# Patient Record
Sex: Male | Born: 1950 | Race: White | Hispanic: No | Marital: Married | Smoking: Never smoker
Health system: Southern US, Community
[De-identification: ages and names within clinical notes are randomized; demographics above are authoritative.]

## PROBLEM LIST (undated history)

## (undated) DIAGNOSIS — M1990 Unspecified osteoarthritis, unspecified site: Secondary | ICD-10-CM

## (undated) DIAGNOSIS — E039 Hypothyroidism, unspecified: Secondary | ICD-10-CM

## (undated) DIAGNOSIS — K432 Incisional hernia without obstruction or gangrene: Secondary | ICD-10-CM

## (undated) DIAGNOSIS — T8859XA Other complications of anesthesia, initial encounter: Secondary | ICD-10-CM

## (undated) DIAGNOSIS — G7000 Myasthenia gravis without (acute) exacerbation: Secondary | ICD-10-CM

## (undated) DIAGNOSIS — E785 Hyperlipidemia, unspecified: Secondary | ICD-10-CM

## (undated) DIAGNOSIS — G20A1 Parkinson's disease without dyskinesia, without mention of fluctuations: Secondary | ICD-10-CM

## (undated) DIAGNOSIS — I10 Essential (primary) hypertension: Secondary | ICD-10-CM

## (undated) DIAGNOSIS — G20 Parkinson's disease: Secondary | ICD-10-CM

## (undated) HISTORY — PX: OTHER SURGICAL HISTORY: SHX169

## (undated) HISTORY — PX: ABDOMINAL SURGERY: SHX537

## (undated) HISTORY — PX: APPENDECTOMY: SHX54

## (undated) HISTORY — PX: HERNIA REPAIR: SHX51

---

## 2019-04-08 HISTORY — PX: COLONOSCOPY: SHX174

## 2021-04-11 ENCOUNTER — Emergency Department: Payer: Medicare Other

## 2021-04-11 ENCOUNTER — Emergency Department
Admission: EM | Admit: 2021-04-11 | Discharge: 2021-04-11 | Disposition: A | Discharge status: Home / Self Care | Payer: Medicare Other | Attending: Emergency Medicine | Admitting: Emergency Medicine

## 2021-04-11 ENCOUNTER — Other Ambulatory Visit: Payer: Self-pay

## 2021-04-11 DIAGNOSIS — K4090 Unilateral inguinal hernia, without obstruction or gangrene, not specified as recurrent: Secondary | ICD-10-CM | POA: Insufficient documentation

## 2021-04-11 DIAGNOSIS — G20 Parkinson's disease: Secondary | ICD-10-CM | POA: Insufficient documentation

## 2021-04-11 DIAGNOSIS — K8020 Calculus of gallbladder without cholecystitis without obstruction: Secondary | ICD-10-CM | POA: Diagnosis not present

## 2021-04-11 DIAGNOSIS — I10 Essential (primary) hypertension: Secondary | ICD-10-CM | POA: Diagnosis not present

## 2021-04-11 DIAGNOSIS — R1032 Left lower quadrant pain: Secondary | ICD-10-CM | POA: Diagnosis present

## 2021-04-11 HISTORY — DX: Parkinson's disease without dyskinesia, without mention of fluctuations: G20.A1

## 2021-04-11 HISTORY — DX: Myasthenia gravis without (acute) exacerbation: G70.00

## 2021-04-11 HISTORY — DX: Parkinson's disease: G20

## 2021-04-11 HISTORY — DX: Essential (primary) hypertension: I10

## 2021-04-11 LAB — COMPREHENSIVE METABOLIC PANEL
ALT: 8 U/L (ref 0–44)
AST: 20 U/L (ref 15–41)
Albumin: 4.4 g/dL (ref 3.5–5.0)
Alkaline Phosphatase: 83 U/L (ref 38–126)
Anion gap: 6 (ref 5–15)
BUN: 26 mg/dL — ABNORMAL HIGH (ref 8–23)
CO2: 29 mmol/L (ref 22–32)
Calcium: 9.4 mg/dL (ref 8.9–10.3)
Chloride: 100 mmol/L (ref 98–111)
Creatinine, Ser: 1.01 mg/dL (ref 0.61–1.24)
GFR, Estimated: >60 mL/min (ref >60)
Glucose, Bld: 104 mg/dL — ABNORMAL HIGH (ref 70–99)
Potassium: 4.9 mmol/L (ref 3.5–5.1)
Sodium: 135 mmol/L (ref 135–145)
Total Bilirubin: 1 mg/dL (ref 0.3–1.2)
Total Protein: 7.5 g/dL (ref 6.5–8.1)

## 2021-04-11 LAB — CBC WITH DIFFERENTIAL/PLATELET
Abs Immature Granulocytes: 0.01 10*3/uL (ref 0.00–0.07)
Basophils Absolute: 0 10*3/uL (ref 0.0–0.1)
Basophils Relative: 1 %
Eosinophils Absolute: 0 10*3/uL (ref 0.0–0.5)
Eosinophils Relative: 1 %
HCT: 43.5 % (ref 39.0–52.0)
Hemoglobin: 14.5 g/dL (ref 13.0–17.0)
Immature Granulocytes: 0 %
Lymphocytes Relative: 12 %
Lymphs Abs: 0.6 10*3/uL — ABNORMAL LOW (ref 0.7–4.0)
MCH: 32.4 pg (ref 26.0–34.0)
MCHC: 33.3 g/dL (ref 30.0–36.0)
MCV: 97.1 fL (ref 80.0–100.0)
Monocytes Absolute: 0.3 10*3/uL (ref 0.1–1.0)
Monocytes Relative: 6 %
Neutro Abs: 4.1 10*3/uL (ref 1.7–7.7)
Neutrophils Relative %: 80 %
Platelets: 204 10*3/uL (ref 150–400)
RBC: 4.48 MIL/uL (ref 4.22–5.81)
RDW: 12.6 % (ref 11.5–15.5)
WBC: 5.1 10*3/uL (ref 4.0–10.5)
nRBC: 0 % (ref 0.0–0.2)

## 2021-04-11 MED ORDER — IOHEXOL 300 MG/ML  SOLN
100.0000 mL | Freq: Once | INTRAMUSCULAR | Status: AC | PRN
  Administered 2021-04-11: 100 mL via INTRAVENOUS

## 2021-04-11 MED ORDER — IBUPROFEN 400 MG PO TABS
400.0000 mg | ORAL_TABLET | Freq: Once | ORAL | Status: AC
  Administered 2021-04-11: 400 mg via ORAL
  Filled 2021-04-11: qty 1

## 2021-04-11 NOTE — Discharge Instructions (Addendum)
You have a small inguinal hernia that contains fat.  If you develop worsening pain, vomiting fevers or notice a bulge that is not able to be reduced, please return to the emergency department.  Please call general surgery clinic at [facility] tomorrow to schedule an appointment.

## 2021-04-11 NOTE — ED Triage Notes (Signed)
Pt to ER via POV with complaints of left sided groin pain. Reports hx of hernia repair on affected side. States pain is sharp and burning, reports bulging in area.

## 2021-04-11 NOTE — ED Provider Triage Note (Signed)
Emergency Medicine Provider Triage Evaluation Note  Martin Orozco , a 71 y.o. male  was evaluated in triage.  Pt complains of left groin pain.  History of hernia on the right side.  States small bulging area with burning sensation.  Was able to palpate back earlier today.  Review of Systems  Positive: Left-sided groin pain, swelling Negative: Fever, chills  Physical Exam  There were no vitals taken for this visit. Gen:   Awake, no distress   Resp:  Normal effort  MSK:   Moves extremities without difficulty  Other:  Tender in left groin  Medical Decision Making  Medically screening exam initiated at 1:32 PM.  Appropriate orders placed.  [name] was informed that the remainder of the evaluation will be completed by another provider, this initial triage assessment does not replace that evaluation, and the importance of remaining in the ED until their evaluation is complete.     Faythe Ghee, PA-C 04/11/21 1333

## 2021-04-11 NOTE — ED Provider Notes (Signed)
[facility] Provider Note    Event Date/Time   First MD Initiated Contact with Patient 04/11/21 1740     (approximate)   History   Groin Pain   HPI  Martin Orozco is a 71 y.o. male past medical history of hypertension, Parkinson's and ocular myasthenia gravis who presents with left groin pain.  Over the last several weeks he has had intermittent pain in the left groin which she describes as like a twinge.  This morning it became more acute and has had some ongoing pain.  Denies nausea vomiting fevers chills constipation or diarrhea.  Denies any skin changes.  Has had hernia repaired on the right side but never on the left.    Past Medical History:  Diagnosis Date   Hypertension    Ocular myasthenia gravis ([place])    Parkinson's disease ([place])     There are no problems to display for this patient.    Physical Exam  Triage Vital Signs: ED Triage Vitals  Enc Vitals Group     BP 04/11/21 1332 (!) 144/77     Pulse Rate 04/11/21 1332 68     Resp 04/11/21 1332 18     Temp 04/11/21 1332 97.9 F (36.6 C)     Temp Source 04/11/21 1332 Oral     SpO2 04/11/21 1332 96 %     Weight 04/11/21 1333 190 lb (86.2 kg)     Height 04/11/21 1333 5\' 8"  (1.727 m)     Head Circumference --      Peak Flow --      Pain Score 04/11/21 1333 5     Pain Loc --      Pain Edu? --      Excl. in [place]? --     Most recent vital signs: Vitals:   04/11/21 1332 04/11/21 1801  BP: (!) 144/77 140/80  Pulse: 68 78  Resp: 18 18  Temp: 97.9 F (36.6 C)   SpO2: 96% 99%     General: Awake, no distress.  CV:  Good peripheral perfusion.  Resp:  Normal effort.  Abd:  No distention.  Abdomen is nontender GU:  No obvious inguinal bulge, there is a palpable inguinal hernia that is reducible, no overlying erythema Neuro:             Awake, Alert, Oriented x 3  Other:     ED Results / Procedures / Treatments  Labs (all labs ordered are listed, but only abnormal results are  displayed) Labs Reviewed  COMPREHENSIVE METABOLIC PANEL - Abnormal; Notable for the following components:      Result Value   Glucose, Bld 104 (*)    BUN 26 (*)    All other components within normal limits  CBC WITH DIFFERENTIAL/PLATELET - Abnormal; Notable for the following components:   Lymphs Abs 0.6 (*)    All other components within normal limits     EKG    RADIOLOGY Viewed the CT abdomen pelvis which shows no acute findings, there is a fat-containing left inguinal hernia, radiology report   PROCEDURES:  Critical Care performed: No  Procedures    MEDICATIONS ORDERED IN ED: Medications  iohexol (OMNIPAQUE) 300 MG/ML solution 100 mL (100 mLs Intravenous Contrast Given 04/11/21 1553)  ibuprofen (ADVIL) tablet 400 mg (400 mg Oral Given 04/11/21 1828)     IMPRESSION / MDM / [place] / ED COURSE  I reviewed the triage vital signs and the nursing notes.  Differential diagnosis includes, but is not limited to, incarcerated hernia, reducible hernia, testicular torsion, epididymitis  71 year old male ending with left inguinal pain.  He is concerned about a hernia.  Had been having pain on and off for several weeks which worsened today.  No symptoms of obstruction or infection.  Vital signs within normal limits.  Patient appears very comfortable on exam.  His abdomen is benign.  And GU exam there is a subtle inguinal hernia that I can palpate but is able to be reduced and there is no overlying tenderness or erythema.  CT abdomen pelvis was obtained which shows a small fat-containing inguinal hernia.  No indication for emergent surgical consult given no evidence of incarceration or strangulation.  Testicular exam is otherwise normal.  Labs were obtained which I reviewed and are unremarkable.  Patient was given Motrin in the ED for pain advised that he follow-up with general surgery for elective repair given he is symptomatic.  He is otherwise  stable for discharge.  We discussed return precautions.     FINAL CLINICAL IMPRESSION(S) / ED DIAGNOSES   Final diagnoses:  Unilateral inguinal hernia without obstruction or gangrene, recurrence not specified     Rx / DC Orders   ED Discharge Orders     None        Note:  This document was prepared using Dragon voice recognition software and may include unintentional dictation errors.   Rada Hay, MD 04/11/21 [phone]

## 2021-04-11 NOTE — ED Notes (Signed)
E-signature not working at this time. Pt verbalized understanding of D/C instructions, prescriptions and follow up care with no further questions at this time. Pt in NAD and ambulatory at time of D/C.  

## 2021-04-11 NOTE — ED Notes (Signed)
Multiple RN attempt IV access and blood draw. No success. IV team consult order placed

## 2021-04-16 ENCOUNTER — Ambulatory Visit: Payer: Self-pay | Admitting: Surgery

## 2021-04-16 NOTE — H&P (Signed)
Subjective: ° °CC: Non-recurrent unilateral inguinal hernia without obstruction or gangrene [K40.90] ° °HPI: ° Martin Orozco is a 71 y.o. male who was referred by Emergency Room for evaluation of above. Symptoms were first noted 6 months ago. Pain is intermittent and discomfort, confined to the left groin, without radiation.  Associated with nothing, exacerbated by exertion.  Lump is reducible.  °  °Past Medical History:  has a past medical history of Hyperlipidemia, Hypertension, Ocular myasthenia gravis (CMS-HCC), Parkinsonism (CMS-HCC), and Thyroid disease. ° °Past Surgical History:  °Past Surgical History: °Procedure Laterality Date ° angiogram   ° APPENDECTOMY   ° knee surgery   ° ° °Family History: family history includes High blood pressure (Hypertension) in his father; Neuropathy in his father; No Known Problems in his mother. ° °Social History:  reports that he has never smoked. He has never used smokeless tobacco. He reports that he does not currently use alcohol. He reports that he does not use drugs. ° °Current Medications: has a current medication list which includes the following prescription(s): atorvastatin, carbidopa-levodopa, coenzyme q10, levothyroxine, olmesartan, azathioprine, and pyridostigmine. ° °Allergies:  °Allergies as of 04/15/2021 ° (No Known Allergies) ° ° °ROS:  °A 15 point review of systems was performed and pertinent positives and negatives noted in HPI °  °Objective: °  ° °BP (!) 140/71    Pulse 78    Ht 172.7 cm (5' 8")    Wt 86.2 kg (190 lb)    BMI 28.89 kg/m²  ° °Constitutional :  Alert, cooperative, no distress °Lymphatics/Throat:  Supple, no lymphadenopathy °Respiratory:  clear to auscultation bilaterally °Cardiovascular:  regular rate and rhythm °Gastrointestinal: soft, non-tender; bowel sounds normal; no masses,  no organomegaly. inguinal hernia noted.  small, reducible, no overlying skin changes and LEFT °Musculoskeletal: Steady gait and movement °Skin: Cool and moist, NO  surgical scars °Psychiatric: Normal affect, non-agitated, not confused °  °  °LABS:  °n/a  ° °RADS: °CLINICAL DATA:  Left-sided groin pain. Prior hernia repair. Palpable  °abnormality in this region.  ° °EXAM:  °CT ABDOMEN AND PELVIS WITH CONTRAST  ° °TECHNIQUE:  °Multidetector CT imaging of the abdomen and pelvis was performed  °using the standard protocol following bolus administration of  °intravenous contrast.  ° °CONTRAST:  100mL OMNIPAQUE IOHEXOL 300 MG/ML  SOLN  ° °COMPARISON:  None.  ° °FINDINGS:  °Lower chest: Mild left hemidiaphragm elevation. Right base scarring.  °Normal heart size without pericardial or pleural effusion.  ° °Hepatobiliary: Normal liver. Heterogeneous density within the  °gallbladder is likely due to dependent sludge including on 27/6 and  °the nondependent stones including on 21/6. No acute cholecystitis or  °biliary duct dilatation.  ° °Pancreas: Normal, without mass or ductal dilatation.  ° °Spleen: Normal in size, without focal abnormality.  ° °Adrenals/Urinary Tract: Normal adrenal glands. Small bilateral renal  °sinus cysts. No hydronephrosis.  ° °Stomach/Bowel: Normal stomach, without wall thickening. Extensive  °colonic diverticulosis. Normal colon and terminal ileum. Normal  °small bowel.  ° °Vascular/Lymphatic: Aortic atherosclerosis. No abdominopelvic  °adenopathy.  ° °Reproductive: Normal prostate.  ° °Other: No significant free fluid. No free intraperitoneal air. Small  °fat containing left inguinal hernia, including on 84/6.  ° °Tiny periumbilical fat containing ventral abdominal wall hernia.  ° °Musculoskeletal: Lumbosacral spondylosis.  Hemangioma within L1.  ° °IMPRESSION:  °1. No acute process in the abdomen or pelvis.  °2. Small fat containing left inguinal hernia.  °3. Cholelithiasis.  °4. Aortic Atherosclerosis (ICD10-I70.0).   ° °Assessment: ° °    °  Non-recurrent unilateral inguinal hernia without obstruction or gangrene [K40.90]  Plan:    1. Non-recurrent  unilateral inguinal hernia without obstruction or gangrene [K40.90]   Discussed the risk of surgery including recurrence, which can be up to 50% in the case of incisional or complex hernias, possible use of prosthetic materials (mesh) and the increased risk of mesh infxn if used, bleeding, chronic pain, post-op infxn, post-op SBO or ileus, and possible re-operation to address said risks. The risks of general anesthetic, if used, includes MI, CVA, sudden death or even reaction to anesthetic medications also discussed. Alternatives include continued observation.  Benefits include possible symptom relief, prevention of incarceration, strangulation, enlargement in size over time, and the risk of emergency surgery in the face of strangulation.   Typical post-op recovery time of 3-5 days with 2 weeks of activity restrictions were also discussed.  ED return precautions given for sudden increase in pain, size of hernia with accompanying fever, nausea, and/or vomiting.  The patient verbalized understanding and all questions were answered to the patient's satisfaction.   2. Patient has elected to proceed with surgical treatment. Procedure will be scheduled. LEFT, robotic assisted laparoscopic

## 2021-04-16 NOTE — H&P (View-Only) (Signed)
Subjective:  CC: Non-recurrent unilateral inguinal hernia without obstruction or gangrene [K40.90]  HPI:  Martin Orozco is a 71 y.o. male who was referred by Emergency Room for evaluation of above. Symptoms were first noted 6 months ago. Pain is intermittent and discomfort, confined to the left groin, without radiation.  Associated with nothing, exacerbated by exertion.  Lump is reducible.    Past Medical History:  has a past medical history of Hyperlipidemia, Hypertension, Ocular myasthenia gravis (CMS-HCC), Parkinsonism (CMS-HCC), and Thyroid disease.  Past Surgical History:  Past Surgical History: Procedure Laterality Date  angiogram    APPENDECTOMY    knee surgery     Family History: family history includes High blood pressure (Hypertension) in his father; Neuropathy in his father; No Known Problems in his mother.  Social History:  reports that he has never smoked. He has never used smokeless tobacco. He reports that he does not currently use alcohol. He reports that he does not use drugs.  Current Medications: has a current medication list which includes the following prescription(s): atorvastatin, carbidopa-levodopa, coenzyme q10, levothyroxine, olmesartan, azathioprine, and pyridostigmine.  Allergies:  Allergies as of 04/15/2021  (No Known Allergies)   ROS:  A 15 point review of systems was performed and pertinent positives and negatives noted in HPI   Objective:    BP (!) 140/71    Pulse 78    Ht 172.7 cm (5\' 8" )    Wt 86.2 kg (190 lb)    BMI 28.89 kg/m   Constitutional :  Alert, cooperative, no distress Lymphatics/Throat:  Supple, no lymphadenopathy Respiratory:  clear to auscultation bilaterally Cardiovascular:  regular rate and rhythm Gastrointestinal: soft, non-tender; bowel sounds normal; no masses,  no organomegaly. inguinal hernia noted.  small, reducible, no overlying skin changes and LEFT Musculoskeletal: Steady gait and movement Skin: Cool and moist, NO  surgical scars Psychiatric: Normal affect, non-agitated, not confused     LABS:  n/a   RADS: CLINICAL DATA:  Left-sided groin pain. Prior hernia repair. Palpable  abnormality in this region.   EXAM:  CT ABDOMEN AND PELVIS WITH CONTRAST   TECHNIQUE:  Multidetector CT imaging of the abdomen and pelvis was performed  using the standard protocol following bolus administration of  intravenous contrast.   CONTRAST:  OMNIPAQUE IOHEXOL 300 MG/ML  SOLN   COMPARISON:  None.   FINDINGS:  Lower chest: Mild left hemidiaphragm elevation. Right base scarring.  Normal heart size without pericardial or pleural effusion.   Hepatobiliary: Normal liver. Heterogeneous density within the  gallbladder is likely due to dependent sludge including on 27/6 and  the nondependent stones including on 21/6. No acute cholecystitis or  biliary duct dilatation.   Pancreas: Normal, without mass or ductal dilatation.   Spleen: Normal in size, without focal abnormality.   Adrenals/Urinary Tract: Normal adrenal glands. Small bilateral renal  sinus cysts. No hydronephrosis.   Stomach/Bowel: Normal stomach, without wall thickening. Extensive  colonic diverticulosis. Normal colon and terminal ileum. Normal  small bowel.   Vascular/Lymphatic: Aortic atherosclerosis. No abdominopelvic  adenopathy.   Reproductive: Normal prostate.   Other: No significant free fluid. No free intraperitoneal air. Small  fat containing left inguinal hernia, including on 84/6.   Tiny periumbilical fat containing ventral abdominal wall hernia.   Musculoskeletal: Lumbosacral spondylosis.  Hemangioma within L1.   IMPRESSION:  1. No acute process in the abdomen or pelvis.  2. Small fat containing left inguinal hernia.  3. Cholelithiasis.  4. Aortic Atherosclerosis (ICD10-I70.0).    Assessment:  Non-recurrent unilateral inguinal hernia without obstruction or gangrene [K40.90]  Plan:    1. Non-recurrent  unilateral inguinal hernia without obstruction or gangrene [K40.90]   Discussed the risk of surgery including recurrence, which can be up to 50% in the case of incisional or complex hernias, possible use of prosthetic materials (mesh) and the increased risk of mesh infxn if used, bleeding, chronic pain, post-op infxn, post-op SBO or ileus, and possible re-operation to address said risks. The risks of general anesthetic, if used, includes MI, CVA, sudden death or even reaction to anesthetic medications also discussed. Alternatives include continued observation.  Benefits include possible symptom relief, prevention of incarceration, strangulation, enlargement in size over time, and the risk of emergency surgery in the face of strangulation.   Typical post-op recovery time of 3-5 days with 2 weeks of activity restrictions were also discussed.  ED return precautions given for sudden increase in pain, size of hernia with accompanying fever, nausea, and/or vomiting.  The patient verbalized understanding and all questions were answered to the patient's satisfaction.   2. Patient has elected to proceed with surgical treatment. Procedure will be scheduled. LEFT, robotic assisted laparoscopic

## 2021-04-22 MED ORDER — CHLORHEXIDINE GLUCONATE 0.12 % MT SOLN: 15.0000 mL | Freq: Once | OROMUCOSAL | Status: AC

## 2021-04-22 MED ORDER — CHLORHEXIDINE GLUCONATE CLOTH 2 % EX PADS: 6.0000 | MEDICATED_PAD | Freq: Once | CUTANEOUS | Status: DC

## 2021-04-22 MED ORDER — ORAL CARE MOUTH RINSE: 15.0000 mL | Freq: Once | OROMUCOSAL | Status: AC

## 2021-04-22 MED ORDER — CEFAZOLIN SODIUM-DEXTROSE 2-4 GM/100ML-% IV SOLN
2.0000 g | INTRAVENOUS | Status: AC
  Administered 2021-04-23: 2 g via INTRAVENOUS

## 2021-04-22 MED ORDER — ACETAMINOPHEN 500 MG PO TABS
1000.0000 mg | ORAL_TABLET | ORAL | Status: AC
Start: 2021-04-23 — End: 2021-04-23

## 2021-04-22 MED ORDER — CELECOXIB 200 MG PO CAPS: 200.0000 mg | ORAL_CAPSULE | ORAL | Status: AC

## 2021-04-22 MED ORDER — GABAPENTIN 300 MG PO CAPS: 300.0000 mg | ORAL_CAPSULE | ORAL | Status: AC

## 2021-04-22 MED ORDER — LACTATED RINGERS IV SOLN: INTRAVENOUS | Status: DC

## 2021-04-22 NOTE — Anesthesia Preprocedure Evaluation (Addendum)
Anesthesia Evaluation  Patient identified by MRN, date of birth, ID band Patient awake    Reviewed: Allergy & Precautions, NPO status , Patient's Chart, lab work & pertinent test results  History of Anesthesia Complications Negative for: history of anesthetic complications  Airway Mallampati: IV   Neck ROM: Full    Dental no notable dental hx.    Pulmonary neg pulmonary ROS,    Pulmonary exam normal breath sounds clear to auscultation       Cardiovascular hypertension, Normal cardiovascular exam Rhythm:Regular Rate:Normal     Neuro/Psych  Neuromuscular disease (ocular myasthenia gravis; Parkinson disease)    GI/Hepatic negative GI ROS,   Endo/Other  negative endocrine ROS  Renal/GU negative Renal ROS     Musculoskeletal   Abdominal   Peds  Hematology negative hematology ROS (+)   Anesthesia Other Findings   Reproductive/Obstetrics                            Anesthesia Physical Anesthesia Plan  ASA: 3  Anesthesia Plan: General   Post-op Pain Management:    Induction: Intravenous  PONV Risk Score and Plan: 2 and Ondansetron, Dexamethasone and Treatment may vary due to age or medical condition  Airway Management Planned: Oral ETT  Additional Equipment:   Intra-op Plan:   Post-operative Plan: Extubation in OR and Possible Post-op intubation/ventilation  Informed Consent: I have reviewed the patients History and Physical, chart, labs and discussed the procedure including the risks, benefits and alternatives for the proposed anesthesia with the patient or authorized representative who has indicated his/her understanding and acceptance.     Dental advisory given  Plan Discussed with: CRNA  Anesthesia Plan Comments: (Considerations for myasthenia gravis: patients are resistant to succinylcholine and sensitive to NDMRs; plan to avoid NDMRs, or titrate to twitches if necessary.   Patient consented for postoperative ventilation if needed.  Patient consented for risks of anesthesia including but not limited to:  - adverse reactions to medications - damage to eyes, teeth, lips or other oral mucosa - nerve damage due to positioning  - sore throat or hoarseness - damage to heart, brain, nerves, lungs, other parts of body or loss of life  Informed patient about role of CRNA in peri- and intra-operative care.  Patient voiced understanding.)        Anesthesia Quick Evaluation

## 2021-04-23 ENCOUNTER — Ambulatory Visit: Payer: Medicare Other | Admitting: Anesthesiology

## 2021-04-23 ENCOUNTER — Other Ambulatory Visit: Payer: Self-pay

## 2021-04-23 ENCOUNTER — Ambulatory Visit
Admission: RE | Admit: 2021-04-23 | Discharge: 2021-04-23 | Disposition: A | Discharge status: Home / Self Care | Payer: Medicare Other | Attending: Surgery | Admitting: Surgery

## 2021-04-23 ENCOUNTER — Encounter
Admission: RE | Disposition: A | Discharge status: Home / Self Care | Payer: Self-pay | Source: Home / Self Care | Attending: Surgery

## 2021-04-23 ENCOUNTER — Encounter: Payer: Self-pay | Admitting: Surgery

## 2021-04-23 DIAGNOSIS — I10 Essential (primary) hypertension: Secondary | ICD-10-CM | POA: Insufficient documentation

## 2021-04-23 DIAGNOSIS — G7000 Myasthenia gravis without (acute) exacerbation: Secondary | ICD-10-CM | POA: Diagnosis not present

## 2021-04-23 DIAGNOSIS — G20 Parkinson's disease: Secondary | ICD-10-CM | POA: Insufficient documentation

## 2021-04-23 DIAGNOSIS — K4090 Unilateral inguinal hernia, without obstruction or gangrene, not specified as recurrent: Secondary | ICD-10-CM | POA: Diagnosis present

## 2021-04-23 HISTORY — PX: INSERTION OF MESH: SHX5868

## 2021-04-23 SURGERY — HERNIORRHAPHY, INGUINAL, ROBOT-ASSISTED, LAPAROSCOPIC
Anesthesia: General | Site: Inguinal | Laterality: Left

## 2021-04-23 MED ORDER — PROPOFOL 10 MG/ML IV BOLUS
INTRAVENOUS | Status: AC
  Filled 2021-04-23: qty 20

## 2021-04-23 MED ORDER — CEFAZOLIN SODIUM-DEXTROSE 2-4 GM/100ML-% IV SOLN
INTRAVENOUS | Status: AC
  Filled 2021-04-23: qty 100

## 2021-04-23 MED ORDER — BUPIVACAINE-EPINEPHRINE (PF) 0.5% -1:200000 IJ SOLN
INTRAMUSCULAR | Status: AC
  Filled 2021-04-23: qty 30

## 2021-04-23 MED ORDER — BUPIVACAINE LIPOSOME 1.3 % IJ SUSP
INTRAMUSCULAR | Status: DC | PRN
  Administered 2021-04-23: 20 mL

## 2021-04-23 MED ORDER — CELECOXIB 200 MG PO CAPS
ORAL_CAPSULE | ORAL | Status: AC
  Administered 2021-04-23: 200 mg via ORAL
  Filled 2021-04-23: qty 1

## 2021-04-23 MED ORDER — HYDROCODONE-ACETAMINOPHEN 5-325 MG PO TABS
1.0000 | ORAL_TABLET | Freq: Four times a day (QID) | ORAL | 0 refills | Status: DC | PRN
Start: 2021-04-23 — End: 2023-08-21

## 2021-04-23 MED ORDER — LIDOCAINE HCL (PF) 2 % IJ SOLN
INTRAMUSCULAR | Status: AC
  Filled 2021-04-23: qty 5

## 2021-04-23 MED ORDER — SEVOFLURANE IN SOLN
RESPIRATORY_TRACT | Status: AC
  Filled 2021-04-23: qty 250

## 2021-04-23 MED ORDER — LIDOCAINE HCL (CARDIAC) PF 100 MG/5ML IV SOSY
PREFILLED_SYRINGE | INTRAVENOUS | Status: DC | PRN
Start: 2021-04-23 — End: 2021-04-23
  Administered 2021-04-23: 80 mg via INTRAVENOUS

## 2021-04-23 MED ORDER — ONDANSETRON HCL 4 MG/2ML IJ SOLN
INTRAMUSCULAR | Status: DC | PRN
Start: 2021-04-23 — End: 2021-04-23
  Administered 2021-04-23: 4 mg via INTRAVENOUS

## 2021-04-23 MED ORDER — ACETAMINOPHEN 10 MG/ML IV SOLN: 1000.0000 mg | Freq: Once | INTRAVENOUS | Status: DC | PRN

## 2021-04-23 MED ORDER — CHLORHEXIDINE GLUCONATE 0.12 % MT SOLN
OROMUCOSAL | Status: AC
  Administered 2021-04-23: 15 mL via OROMUCOSAL
  Filled 2021-04-23: qty 15

## 2021-04-23 MED ORDER — LACTATED RINGERS IV SOLN: INTRAVENOUS | Status: DC

## 2021-04-23 MED ORDER — DEXAMETHASONE SODIUM PHOSPHATE 10 MG/ML IJ SOLN
INTRAMUSCULAR | Status: AC
  Filled 2021-04-23: qty 1

## 2021-04-23 MED ORDER — OXYCODONE HCL 5 MG PO TABS
ORAL_TABLET | ORAL | Status: AC
  Filled 2021-04-23: qty 1

## 2021-04-23 MED ORDER — GABAPENTIN 300 MG PO CAPS
ORAL_CAPSULE | ORAL | Status: AC
  Administered 2021-04-23: 300 mg via ORAL
  Filled 2021-04-23: qty 1

## 2021-04-23 MED ORDER — SUGAMMADEX SODIUM 200 MG/2ML IV SOLN
INTRAVENOUS | Status: DC | PRN
  Administered 2021-04-23: 200 mg via INTRAVENOUS

## 2021-04-23 MED ORDER — BUPIVACAINE-EPINEPHRINE (PF) 0.5% -1:200000 IJ SOLN: INTRAMUSCULAR | Status: DC | PRN

## 2021-04-23 MED ORDER — ACETAMINOPHEN 500 MG PO TABS
ORAL_TABLET | ORAL | Status: AC
  Administered 2021-04-23: 1000 mg via ORAL
  Filled 2021-04-23: qty 2

## 2021-04-23 MED ORDER — BUPIVACAINE-EPINEPHRINE 0.5% -1:200000 IJ SOLN
INTRAMUSCULAR | Status: DC | PRN
  Administered 2021-04-23: 22 mL

## 2021-04-23 MED ORDER — PROPOFOL 10 MG/ML IV BOLUS
INTRAVENOUS | Status: DC | PRN
  Administered 2021-04-23: 160 mg via INTRAVENOUS

## 2021-04-23 MED ORDER — GLYCOPYRROLATE 0.2 MG/ML IJ SOLN
INTRAMUSCULAR | Status: AC
  Filled 2021-04-23: qty 1

## 2021-04-23 MED ORDER — OXYCODONE HCL 5 MG PO TABS
5.0000 mg | ORAL_TABLET | Freq: Once | ORAL | Status: AC | PRN
  Administered 2021-04-23: 5 mg via ORAL

## 2021-04-23 MED ORDER — BUPIVACAINE LIPOSOME 1.3 % IJ SUSP
INTRAMUSCULAR | Status: AC
  Filled 2021-04-23: qty 20

## 2021-04-23 MED ORDER — 0.9 % SODIUM CHLORIDE (POUR BTL) OPTIME
TOPICAL | Status: DC | PRN
  Administered 2021-04-23: 25 mL

## 2021-04-23 MED ORDER — PHENYLEPHRINE HCL (PRESSORS) 10 MG/ML IV SOLN
INTRAVENOUS | Status: DC | PRN
  Administered 2021-04-23 (×2): 160 ug via INTRAVENOUS
  Administered 2021-04-23 (×4): 80 ug via INTRAVENOUS

## 2021-04-23 MED ORDER — FENTANYL CITRATE (PF) 100 MCG/2ML IJ SOLN
INTRAMUSCULAR | Status: AC
  Filled 2021-04-23: qty 2

## 2021-04-23 MED ORDER — ONDANSETRON HCL 4 MG/2ML IJ SOLN: 4.0000 mg | Freq: Once | INTRAMUSCULAR | Status: DC | PRN

## 2021-04-23 MED ORDER — PHENYLEPHRINE HCL-NACL 20-0.9 MG/250ML-% IV SOLN
INTRAVENOUS | Status: AC
  Filled 2021-04-23: qty 250

## 2021-04-23 MED ORDER — DEXAMETHASONE SODIUM PHOSPHATE 10 MG/ML IJ SOLN
INTRAMUSCULAR | Status: DC | PRN
Start: 2021-04-23 — End: 2021-04-23
  Administered 2021-04-23: 10 mg via INTRAVENOUS

## 2021-04-23 MED ORDER — ROCURONIUM BROMIDE 10 MG/ML (PF) SYRINGE
PREFILLED_SYRINGE | INTRAVENOUS | Status: AC
  Filled 2021-04-23: qty 10

## 2021-04-23 MED ORDER — ROCURONIUM BROMIDE 100 MG/10ML IV SOLN
INTRAVENOUS | Status: DC | PRN
  Administered 2021-04-23 (×2): 20 mg via INTRAVENOUS

## 2021-04-23 MED ORDER — DOCUSATE SODIUM 100 MG PO CAPS: 100.0000 mg | ORAL_CAPSULE | Freq: Two times a day (BID) | ORAL | 0 refills | Status: AC | PRN

## 2021-04-23 MED ORDER — OXYCODONE HCL 5 MG/5ML PO SOLN: 5.0000 mg | Freq: Once | ORAL | Status: AC | PRN

## 2021-04-23 MED ORDER — SODIUM CHLORIDE FLUSH 0.9 % IV SOLN
INTRAVENOUS | Status: AC
  Filled 2021-04-23: qty 10

## 2021-04-23 MED ORDER — ONDANSETRON HCL 4 MG/2ML IJ SOLN
INTRAMUSCULAR | Status: AC
  Filled 2021-04-23: qty 2

## 2021-04-23 MED ORDER — SUCCINYLCHOLINE CHLORIDE 200 MG/10ML IV SOSY
PREFILLED_SYRINGE | INTRAVENOUS | Status: DC | PRN
Start: 2021-04-23 — End: 2021-04-23
  Administered 2021-04-23: 100 mg via INTRAVENOUS

## 2021-04-23 MED ORDER — FENTANYL CITRATE (PF) 100 MCG/2ML IJ SOLN
INTRAMUSCULAR | Status: DC | PRN
  Administered 2021-04-23 (×2): 25 ug via INTRAVENOUS
  Administered 2021-04-23: 50 ug via INTRAVENOUS

## 2021-04-23 MED ORDER — LIDOCAINE HCL (PF) 1 % IJ SOLN
INTRAMUSCULAR | Status: AC
  Filled 2021-04-23: qty 30

## 2021-04-23 MED ORDER — FENTANYL CITRATE (PF) 100 MCG/2ML IJ SOLN
25.0000 ug | INTRAMUSCULAR | Status: DC | PRN
  Administered 2021-04-23: 25 ug via INTRAVENOUS

## 2021-04-23 SURGICAL SUPPLY — 51 items
ADH SKN CLS APL DERMABOND .7 (GAUZE/BANDAGES/DRESSINGS) ×2
BAG INFUSER PRESSURE 100CC (MISCELLANEOUS) IMPLANT
BLADE SURG SZ11 CARB STEEL (BLADE) ×3 IMPLANT
BNDG GAUZE ELAST 4 BULKY (GAUZE/BANDAGES/DRESSINGS) ×3 IMPLANT
COVER TIP SHEARS 8 DVNC (MISCELLANEOUS) ×2 IMPLANT
COVER TIP SHEARS 8MM DA VINCI (MISCELLANEOUS) ×1
DERMABOND ADVANCED (GAUZE/BANDAGES/DRESSINGS) ×1
DERMABOND ADVANCED .7 DNX12 (GAUZE/BANDAGES/DRESSINGS) ×2 IMPLANT
DRAPE ARM DVNC X/XI (DISPOSABLE) ×6 IMPLANT
DRAPE COLUMN DVNC XI (DISPOSABLE) ×2 IMPLANT
DRAPE DA VINCI XI ARM (DISPOSABLE) ×3
DRAPE DA VINCI XI COLUMN (DISPOSABLE) ×1
ELECT CAUTERY BLADE 6.4 (BLADE) IMPLANT
ELECT REM PT RETURN 9FT ADLT (ELECTROSURGICAL) ×3
ELECTRODE REM PT RTRN 9FT ADLT (ELECTROSURGICAL) ×2 IMPLANT
GAUZE 4X4 16PLY ~~LOC~~+RFID DBL (SPONGE) ×3 IMPLANT
GLOVE SURG SYN 6.5 ES PF (GLOVE) ×6 IMPLANT
GLOVE SURG SYN 6.5 PF PI (GLOVE) ×4 IMPLANT
GLOVE SURG UNDER POLY LF SZ7 (GLOVE) ×6 IMPLANT
GOWN STRL REUS W/ TWL LRG LVL3 (GOWN DISPOSABLE) ×6 IMPLANT
GOWN STRL REUS W/TWL LRG LVL3 (GOWN DISPOSABLE) ×9
IRRIGATOR SUCT 8 DISP DVNC XI (IRRIGATION / IRRIGATOR) IMPLANT
IRRIGATOR SUCTION 8MM XI DISP (IRRIGATION / IRRIGATOR)
IV NS 1000ML (IV SOLUTION)
IV NS 1000ML BAXH (IV SOLUTION) IMPLANT
LABEL OR SOLS (LABEL) IMPLANT
MANIFOLD NEPTUNE II (INSTRUMENTS) ×3 IMPLANT
MESH 3DMAX 4X6 LT LRG (Mesh General) ×1 IMPLANT
MESH 3DMAX MID 4X6 LT LRG (Mesh General) IMPLANT
NDL INSUFFLATION 14GA 120MM (NEEDLE) ×2 IMPLANT
NEEDLE HYPO 22GX1.5 SAFETY (NEEDLE) ×3 IMPLANT
NEEDLE INSUFFLATION 14GA 120MM (NEEDLE) ×3 IMPLANT
NS IRRIG 500ML POUR BTL (IV SOLUTION) ×1 IMPLANT
OBTURATOR OPTICAL STANDARD 8MM (TROCAR) ×1
OBTURATOR OPTICAL STND 8 DVNC (TROCAR) ×2
OBTURATOR OPTICALSTD 8 DVNC (TROCAR) ×2 IMPLANT
PACK LAP CHOLECYSTECTOMY (MISCELLANEOUS) ×3 IMPLANT
PENCIL ELECTRO HAND CTR (MISCELLANEOUS) ×3 IMPLANT
SEAL CANN UNIV 5-8 DVNC XI (MISCELLANEOUS) ×6 IMPLANT
SEAL XI 5MM-8MM UNIVERSAL (MISCELLANEOUS) ×3
SET TUBE SMOKE EVAC HIGH FLOW (TUBING) ×3 IMPLANT
SOLUTION ELECTROLUBE (MISCELLANEOUS) ×3 IMPLANT
SUT MNCRL 4-0 (SUTURE) ×6
SUT MNCRL 4-0 27XMFL (SUTURE) ×4
SUT VIC AB 2-0 SH 27 (SUTURE) ×3
SUT VIC AB 2-0 SH 27XBRD (SUTURE) ×2 IMPLANT
SUT VLOC 90 6 CV-15 VIOLET (SUTURE) ×6 IMPLANT
SUTURE MNCRL 4-0 27XMF (SUTURE) ×2 IMPLANT
SYR 30ML LL (SYRINGE) ×3 IMPLANT
TAPE TRANSPORE STRL 2 31045 (GAUZE/BANDAGES/DRESSINGS) ×3 IMPLANT
WATER STERILE IRR 500ML POUR (IV SOLUTION) ×2 IMPLANT

## 2021-04-23 NOTE — Interval H&P Note (Signed)
No change. OK to proceed.

## 2021-04-23 NOTE — Anesthesia Procedure Notes (Signed)
Procedure Name: Intubation Date/Time: 04/23/2021 11:33 AM Performed by: Hezzie Bump, CRNA Pre-anesthesia Checklist: Patient identified, Patient being monitored, Timeout performed, Emergency Drugs available and Suction available Patient Re-evaluated:Patient Re-evaluated prior to induction Oxygen Delivery Method: Circle system utilized Preoxygenation: Pre-oxygenation with 100% oxygen Induction Type: IV induction Ventilation: Mask ventilation without difficulty Laryngoscope Size: McGraph and 4 Grade View: Grade I Tube type: Oral Tube size: 7.5 mm Number of attempts: 1 Airway Equipment and Method: Stylet and Video-laryngoscopy Placement Confirmation: ETT inserted through vocal cords under direct vision, positive ETCO2 and breath sounds checked- equal and bilateral Secured at: 21 cm Tube secured with: Tape Dental Injury: Teeth and Oropharynx as per pre-operative assessment

## 2021-04-23 NOTE — Discharge Instructions (Addendum)
Hernia repair, Care After This sheet gives you information about how to care for yourself after your procedure. Your health care provider may also give you more specific instructions. If you have problems or questions, contact your health care provider. What can I expect after the procedure? After your procedure, it is common to have the following: Pain in your abdomen, especially in the incision areas. You will be given medicine to control the pain. Tiredness. This is a normal part of the recovery process. Your energy level will return to normal over the next several weeks. Changes in your bowel movements, such as constipation or needing to go more often. Talk with your health care provider about how to manage this. Follow these instructions at home: Medicines  tylenol and alleve as needed for discomfort.  Please alternate between the two every four hours as needed for pain.    Use narcotics, if prescribed, only when tylenol and motrin is not enough to control pain.  325-650mg  every 8hrs to max of 3000mg /24hrs (including the 325mg  in every norco dose) for the tylenol.     PLEASE RECORD NUMBER OF PILLS TAKEN UNTIL NEXT FOLLOW UP APPT.  THIS WILL HELP DETERMINE HOW READY YOU ARE TO BE RELEASED FROM ANY ACTIVITY RESTRICTIONS Do not drive or use heavy machinery while taking prescription pain medicine. Do not drink alcohol while taking prescription pain medicine.  Incision care    Follow instructions from your health care provider about how to take care of your incision areas. Make sure you: Keep your incisions clean and dry. Wash your hands with soap and water before and after applying medicine to the areas, and before and after changing your bandage (dressing). If soap and water are not available, use hand sanitizer. Change your dressing as told by your health care provider. Leave stitches (sutures), skin glue, or adhesive strips in place. These skin closures may need to stay in place for 2  weeks or longer. If adhesive strip edges start to loosen and curl up, you may trim the loose edges. Do not remove adhesive strips completely unless your health care provider tells you to do that. Do not wear tight clothing over the incisions. Tight clothing may rub and irritate the incision areas, which may cause the incisions to open. Do not take baths, swim, or use a hot tub until your health care provider approves. OK TO SHOWER IN 24HRS.   Check your incision area every day for signs of infection. Check for: More redness, swelling, or pain. More fluid or blood. Warmth. Pus or a bad smell. Activity Avoid lifting anything that is heavier than 10 lb (4.5 kg) for 2 weeks or until your health care provider says it is okay. No pushing/pulling greater than 30lbs You may resume normal activities as told by your health care provider. Ask your health care provider what activities are safe for you. Take rest breaks during the day as needed. Eating and drinking Follow instructions from your health care provider about what you can eat after surgery. To prevent or treat constipation while you are taking prescription pain medicine, your health care provider may recommend that you: Drink enough fluid to keep your urine clear or pale yellow. Take over-the-counter or prescription medicines. Eat foods that are high in fiber, such as fresh fruits and vegetables, whole grains, and beans. Limit foods that are high in fat and processed sugars, such as fried and sweet foods. General instructions Ask your health care provider when you will need  an appointment to get your sutures or staples removed. Keep all follow-up visits as told by your health care provider. This is important. Contact a health care provider if: You have more redness, swelling, or pain around your incisions. You have more fluid or blood coming from the incisions. Your incisions feel warm to the touch. You have pus or a bad smell coming from  your incisions or your dressing. You have a fever. You have an incision that breaks open (edges not staying together) after sutures or staples have been removed. You develop a rash. You have chest pain or difficulty breathing. You have pain or swelling in your legs. You feel light-headed or you faint. Your abdomen swells (becomes distended). You have nausea or vomiting. You have blood in your stool (feces). This information is not intended to replace advice given to you by your health care provider. Make sure you discuss any questions you have with your health care provider. Document Released: 10/11/2004 Document Revised: 12/11/2017 Document Reviewed: 12/24/2015 Elsevier Interactive Patient Education  2019 [place]   The drugs that you were given will stay in your system until tomorrow so for the next 24 hours you should not:  Drive an automobile Make any legal decisions Drink any alcoholic beverage   You may resume regular meals tomorrow.  Today it is better to start with liquids and gradually work up to solid foods.  You may eat anything you prefer, but it is better to start with liquids, then soup and crackers, and gradually work up to solid foods.   Please notify your doctor immediately if you have any unusual bleeding, trouble breathing, redness and pain at the surgery site, drainage, fever, or pain not relieved by medication.    Your post-operative visit with Dr.                                       is: Date:                        Time:    Please call to schedule your post-operative visit.  Additional Instructions:

## 2021-04-23 NOTE — Transfer of Care (Signed)
Immediate Anesthesia Transfer of Care Note  Patient: Martin Orozco  Procedure(s) Performed: XI ROBOTIC ASSISTED INGUINAL HERNIA (Left: Abdomen) INSERTION OF MESH (Left: Inguinal)  Patient Location: PACU  Anesthesia Type:General  Level of Consciousness: awake and alert   Airway & Oxygen Therapy: Patient Spontanous Breathing and Patient connected to nasal cannula oxygen  Post-op Assessment: Report given to RN and Post -op Vital signs reviewed and stable  Post vital signs: Reviewed and stable  Last Vitals:  Vitals Value Taken Time  BP 108/46 04/23/21 1252  Temp 36.3 C 04/23/21 1252  Pulse 73 04/23/21 1252  Resp 16 04/23/21 1252  SpO2 99 % 04/23/21 1252    Last Pain:  Vitals:   04/23/21 1252  TempSrc:   PainSc: 5          Complications: No notable events documented.

## 2021-04-23 NOTE — Op Note (Signed)
Preoperative diagnosis: left inguinal Hernia, reducible  Postoperative diagnosis: left inguinal Hernia  Procedure: Robotic assisted laparoscopic left inguinal hernia repair with mesh  Anesthesia: General  Surgeon: Dr. Lysle Pearl  Wound Classification: Clean  Specimen: none  Complications: None  Estimated Blood Loss: 13mL   Indications:  inguinal hernia. Repair was indicated to avoid complications of incarceration, obstruction and pain, and a prosthetic mesh repair was elected.  See H&P for further details.  Findings: 1. Vas Deferens and cord structures identified and preserved 2. Bard 3D max medium weight mesh used for repair 3. Adequate hemostasis achieved  Description of procedure: The patient was taken to the operating room. A time-out was completed verifying correct patient, procedure, site, positioning, and implant(s) and/or special equipment prior to beginning this procedure.  Area was prepped and draped in the usual sterile fashion. An incision was marked 20 cm above the pubic tubercle, slightly above the umbilicus  Scrotum wrapped in Kerlix roll.  Veress needle inserted at palmer's point.  Saline drop test noted to be positive with gradual increase in pressure after initiation of gas insufflation.  15 mm of pressure was achieved prior to removing the Veress needle and then placing a 8 mm port via the Optiview technique through the supraumbilical site.  Inspection of the area afterwards noted no injury to the surrounding organs during insertion of the needle and the port.  2 port sites were marked 8 cm to the lateral sides of the initial port, and a 8 mm robotic port was placed on the left side, another 8 mm robotic port on the right side under direct supervision.  Local anesthesia  infused to the preplanned incision sites prior to insertion of the port.  The [place] was then brought into the operative field and docked to the ports.  Examination of the abdominal cavity  noted a left inguinal hernia.  A peritoneal flap was created approximately 8cm cephalad to the defect by using scissors with electrocautery.  Dissection was carried down towards the pubic tubercle, developing the myopectineal orifice view.  Laterally the flap was carried towards the ASIS.  Small hernia sac and larger lipoma was noted, which carefully dissected away from the adjacent tissues to be fully reduced out of hernia cavity.  Any bleeding was controlled with combination of electrocautery and manual pressure.    After confirming adequate dissection and the peritoneal reflection completely down and away from the cord structures, a Large Bard 3DMax medium weight mesh was placed within the anterior abdominal wall, secured in place using 2-0 Vicryl on an SH needle immediately above the pubic tubercle.  After noting proper placement of the mesh with the peritoneal reflection deep to it, the previously created peritoneal flap was secured back up to the anterior abdominal wall using running 3-0 V-Lock.  Both needles were then removed out of the abdominal cavity, Xi platform undocked from the ports and removed off of operative field.  exparel infused as ilioinguinal block.  Abdomen then desufflated and ports removed. All the skin incisions were then closed with a subcuticular stitch of Monocryl 4-0. Dermabond was applied. The testis was gently pulled down into its anatomic position in the scrotum.  The patient tolerated the procedure well and was taken to the postanesthesia care unit in stable condition. Sponge and instrument count correct at end of procedure.

## 2021-04-25 ENCOUNTER — Encounter: Payer: Self-pay | Admitting: Surgery

## 2021-04-28 NOTE — Anesthesia Postprocedure Evaluation (Signed)
Anesthesia Post Note  Patient: Martin Orozco  Procedure(s) Performed: XI ROBOTIC ASSISTED INGUINAL HERNIA (Left: Abdomen) INSERTION OF MESH (Left: Inguinal)  Patient location during evaluation: PACU Anesthesia Type: General Level of consciousness: awake and alert Pain management: pain level controlled Vital Signs Assessment: post-procedure vital signs reviewed and stable Respiratory status: spontaneous breathing, nonlabored ventilation, respiratory function stable and patient connected to nasal cannula oxygen Cardiovascular status: blood pressure returned to baseline and stable Postop Assessment: no apparent nausea or vomiting Anesthetic complications: no   No notable events documented.   Last Vitals:  Vitals:   04/23/21 1358 04/23/21 1628  BP: (!) 122/55 132/73  Pulse: 73 94  Resp: 16 15  Temp: (!) 36.1 C (!) 36.2 C  SpO2: 94% 97%    Last Pain:  Vitals:   04/24/21 0853  TempSrc:   PainSc: 0-No pain                 Martha Clan

## 2021-06-20 ENCOUNTER — Other Ambulatory Visit: Payer: Self-pay

## 2021-06-20 ENCOUNTER — Emergency Department: Payer: Medicare Other

## 2021-06-20 ENCOUNTER — Emergency Department
Admission: EM | Admit: 2021-06-20 | Discharge: 2021-06-20 | Disposition: A | Discharge status: Home / Self Care | Payer: Medicare Other | Attending: Emergency Medicine | Admitting: Emergency Medicine

## 2021-06-20 DIAGNOSIS — M79605 Pain in left leg: Secondary | ICD-10-CM | POA: Diagnosis present

## 2021-06-20 DIAGNOSIS — R600 Localized edema: Secondary | ICD-10-CM | POA: Insufficient documentation

## 2021-06-20 DIAGNOSIS — I10 Essential (primary) hypertension: Secondary | ICD-10-CM | POA: Insufficient documentation

## 2021-06-20 DIAGNOSIS — M7989 Other specified soft tissue disorders: Secondary | ICD-10-CM | POA: Diagnosis not present

## 2021-06-20 LAB — CBC WITH DIFFERENTIAL/PLATELET
Abs Immature Granulocytes: 0.04 10*3/uL (ref 0.00–0.07)
Basophils Absolute: 0.1 10*3/uL (ref 0.0–0.1)
Basophils Relative: 1 %
Eosinophils Absolute: 0 10*3/uL (ref 0.0–0.5)
Eosinophils Relative: 0 %
HCT: 45.4 % (ref 39.0–52.0)
Hemoglobin: 14.6 g/dL (ref 13.0–17.0)
Immature Granulocytes: 1 %
Lymphocytes Relative: 11 %
Lymphs Abs: 0.6 10*3/uL — ABNORMAL LOW (ref 0.7–4.0)
MCH: 31.3 pg (ref 26.0–34.0)
MCHC: 32.2 g/dL (ref 30.0–36.0)
MCV: 97.2 fL (ref 80.0–100.0)
Monocytes Absolute: 0.3 10*3/uL (ref 0.1–1.0)
Monocytes Relative: 6 %
Neutro Abs: 4.3 10*3/uL (ref 1.7–7.7)
Neutrophils Relative %: 81 %
Platelets: 210 10*3/uL (ref 150–400)
RBC: 4.67 MIL/uL (ref 4.22–5.81)
RDW: 13.3 % (ref 11.5–15.5)
WBC: 5.3 10*3/uL (ref 4.0–10.5)
nRBC: 0 % (ref 0.0–0.2)

## 2021-06-20 LAB — COMPREHENSIVE METABOLIC PANEL
ALT: 12 U/L (ref 0–44)
AST: 33 U/L (ref 15–41)
Albumin: 4.2 g/dL (ref 3.5–5.0)
Alkaline Phosphatase: 74 U/L (ref 38–126)
Anion gap: 13 (ref 5–15)
BUN: 21 mg/dL (ref 8–23)
CO2: 25 mmol/L (ref 22–32)
Calcium: 9.3 mg/dL (ref 8.9–10.3)
Chloride: 98 mmol/L (ref 98–111)
Creatinine, Ser: 0.84 mg/dL (ref 0.61–1.24)
GFR, Estimated: >60 mL/min (ref >60)
Glucose, Bld: 85 mg/dL (ref 70–99)
Potassium: 4.2 mmol/L (ref 3.5–5.1)
Sodium: 136 mmol/L (ref 135–145)
Total Bilirubin: 1.1 mg/dL (ref 0.3–1.2)
Total Protein: 7.1 g/dL (ref 6.5–8.1)

## 2021-06-20 LAB — BRAIN NATRIURETIC PEPTIDE: B Natriuretic Peptide: 4 pg/mL (ref 0.0–100.0)

## 2021-06-20 MED ORDER — TRAMADOL HCL 50 MG PO TABS: 50.0000 mg | ORAL_TABLET | Freq: Four times a day (QID) | ORAL | 0 refills | Status: AC | PRN

## 2021-06-20 MED ORDER — IOHEXOL 350 MG/ML SOLN
125.0000 mL | Freq: Once | INTRAVENOUS | Status: AC | PRN
  Administered 2021-06-20: 125 mL via INTRAVENOUS
  Filled 2021-06-20: qty 125

## 2021-06-20 NOTE — ED Notes (Signed)
See triage note  presents with pain to left leg  states he noticed pain from left groin area couple of days ago  states pain became worse last pm  min swelling noted ?

## 2021-06-20 NOTE — ED Notes (Signed)
Pt with IV insertion x2 by this RN and by student RN without success,Labs drawn by straight stick.  ?

## 2021-06-20 NOTE — ED Provider Notes (Addendum)
? ?[facility] ?Provider Note ? ? ? Event Date/Time  ? First MD Initiated Contact with Patient 06/20/21 1037   ?  (approximate) ? ? ?History  ? ?Leg Pain ? ? ?HPI ? ?Martin Orozco is a 71 y.o. male with a history of hypertension, Parkinson's disease and ocular myasthenia gravis who presents with left leg pain over the last several days.  The patient states that the pain was mild for few days, and then got much worse yesterday afternoon but was not precipitated by any trauma or injury.  The patient states that the pain starts in his left groin and goes down throughout the whole leg.  It is a sharp, throbbing pain.  It is worse when he tries to move or walk.  He denies any associated numbness or weakness.  He states that there has been some bilateral lower leg swelling in both legs recently, but it has not gotten any worse.  He denies fever or chills. ? ?  ? ? ?Physical Exam  ? ?Triage Vital Signs: ?ED Triage Vitals  ?Enc Vitals Group  ?   BP 06/20/21 1028 136/70  ?   Pulse Rate 06/20/21 1028 86  ?   Resp 06/20/21 1028 17  ?   Temp 06/20/21 1028 98.3 ?F (36.8 ?C)  ?   Temp Source 06/20/21 1028 Oral  ?   SpO2 06/20/21 1028 99 %  ?   Weight 06/20/21 1040 190 lb 0.6 oz (86.2 kg)  ?   Height 06/20/21 1040 5\' 8"  (1.727 m)  ?   Head Circumference --   ?   Peak Flow --   ?   Pain Score 06/20/21 1026 7  ?   Pain Loc --   ?   Pain Edu? --   ?   Excl. in GC? --   ? ? ?Most recent vital signs: ?Vitals:  ? 06/20/21 1315 06/20/21 1632  ?BP: 135/75 130/70  ?Pulse: 80 78  ?Resp: 18 18  ?Temp:    ?SpO2: 98% 98%  ? ? ? ?General: Awake, no distress.  ?CV:  Good peripheral perfusion.  ?Resp:  Normal effort.  ?Abd:  No distention.  ?Other:  Bilateral 1+ pitting edema to lower extremities.  L LE with full range of motion at hip, knee, and ankle.  No erythema, induration, or abnormal warmth.  No tenderness to palpation.  2+ DP pulse.  Normal color.  Cap refill less than 2 seconds distally.  5/5 motor strength and  intact sensation proximal and distal. ? ? ?ED Results / Procedures / Treatments  ? ?Labs ?(all labs ordered are listed, but only abnormal results are displayed) ?Labs Reviewed  ?CBC WITH DIFFERENTIAL/PLATELET - Abnormal; Notable for the following components:  ?    Result Value  ? Lymphs Abs 0.6 (*)   ? All other components within normal limits  ?BRAIN NATRIURETIC PEPTIDE  ?COMPREHENSIVE METABOLIC PANEL  ? ? ? ?EKG ? ? ? ? ?RADIOLOGY ? ?06/22/21 venous LLE: I independently viewed and interpreted the images; there is no evidence of DVT. ? ?CT angio aortia/bifemoral: ? ?IMPRESSION:  ?VASCULAR  ?   ?1. 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 Aortic Atherosclerosis (ICD10-I70.0).  ?2. Widely patent bilateral inflow, outflow, and 3 vessel runoffs.  ?   ?NON-VASCULAR  ?   ?1. No acute abdominopelvic abnormality.  ?2. Cholelithiasis without evidence of cholecystitis.  ?3. Diverticulosis without evidence of diverticulitis.  ?4. Small fat containing umbilical hernia.  ? ? ?  PROCEDURES: ? ?Critical Care performed: No ? ?Procedures ? ? ?MEDICATIONS ORDERED IN ED: ?Medications  ?iohexol (OMNIPAQUE) 350 MG/ML injection 125 mL (125 mLs Intravenous Contrast Given 06/20/21 1426)  ? ? ? ?IMPRESSION / MDM / ASSESSMENT AND PLAN / ED COURSE  ?I reviewed the triage vital signs and the nursing notes. ? ?71 year old male with PMH as noted above presents with left leg pain over the last several days, atraumatic, and involving the whole leg from the groin down. ? ?On exam the patient is overall well-appearing with normal vital signs.  He has good range of motion in all joints of the LLE, and the extremity is neuro/vascular intact. ? ?Differential diagnosis includes, but is not limited to, sciatica, radicular pain, muscle strain or spasm, DVT.  I have a low suspicion for claudication or any arterial embolus or insufficiency given that it involves the whole leg and since the patient has a normal neurovascular  exam. ? ?We will obtain lab work-up, LLE ultrasound, and reassess. ? ?----------------------------------------- ?3:29 PM on 06/20/2021 ?----------------------------------------- ? ?Ultrasound showed no evidence of DVT.  I discussed this with the patient.  Although he has a normal neuro/vascular exam to the left lower extremity currently, he does report that the pain got a lot worse after he was walking yesterday in a manner that could be consistent with claudication.  I discussed the possibility of obtaining a CT angiogram; the patient expressed a strong preference to go ahead with this to rule out arterial vascular causes. ? ?CT is negative.  The patient has been ambulating around the ED without difficulty.  He is stable for discharge at this time.  Overall I suspect most likely radicular pain versus possible sequela of his Parkinson's as he reports that that leg is particularly affected. ? ?I will prescribe some pain medication.  The patient has follow-up with his PMD arranged for tomorrow.  Return precautions given, and he expresses understanding. ? ? ?FINAL CLINICAL IMPRESSION(S) / ED DIAGNOSES  ? ?Final diagnoses:  ?Left leg pain  ? ? ? ?Rx / DC Orders  ? ?ED Discharge Orders   ? ?      Ordered  ?  traMADol (ULTRAM) 50 MG tablet  Every 6 hours PRN       ? 06/20/21 1635  ? ?  ?  ? ?  ? ? ? ?Note:  This document was prepared using Dragon voice recognition software and may include unintentional dictation errors.  ?  ?Dionne Bucy, MD ?06/20/21 1533 ? ?  ?Dionne Bucy, MD ?06/20/21 1636 ? ?

## 2021-06-20 NOTE — Discharge Instructions (Signed)
Follow-up with your primary care doctor as scheduled tomorrow.  Take the tramadol as needed for pain. ? ?Return to the ER for new, worsening, or persistent severe leg pain, weakness or numbness, difficulty walking or bearing weight, change in color, worsening swelling, or any other new or worsening symptoms that concern you. ?

## 2021-06-20 NOTE — ED Triage Notes (Signed)
Pt c/o left leg pain for the past 2 days, worse since last night, pt has BL LE swelling, denies hx of DVT but is concerned it may be a clot/bloackage ?

## 2021-11-25 ENCOUNTER — Other Ambulatory Visit: Payer: Self-pay | Admitting: Surgery

## 2021-11-25 ENCOUNTER — Other Ambulatory Visit (HOSPITAL_COMMUNITY): Payer: Self-pay | Admitting: Surgery

## 2021-11-25 DIAGNOSIS — R1031 Right lower quadrant pain: Secondary | ICD-10-CM

## 2021-11-28 ENCOUNTER — Ambulatory Visit
Admission: RE | Admit: 2021-11-28 | Discharge: 2021-11-28 | Disposition: A | Discharge status: Home / Self Care | Payer: Medicare Other | Source: Ambulatory Visit | Attending: Surgery | Admitting: Surgery

## 2021-11-28 DIAGNOSIS — R1031 Right lower quadrant pain: Secondary | ICD-10-CM | POA: Insufficient documentation

## 2022-11-14 IMAGING — US US EXTREM LOW VENOUS*L*
1 series · 14 of 24 positions shown · non-contrast
Comparison: None.

CLINICAL DATA: Pain and swelling

EXAM:
Left LOWER EXTREMITY VENOUS DOPPLER ULTRASOUND
TECHNIQUE: Gray-scale sonography with compression, as well as color and duplex
ultrasound, were performed to evaluate the deep venous system(s)
from the level of the common femoral vein through the popliteal and
proximal calf veins.

[Series 1: us extrem low venous*left* · 0.11mm/px · 14 of 33 slices shown]
[im 1/33]
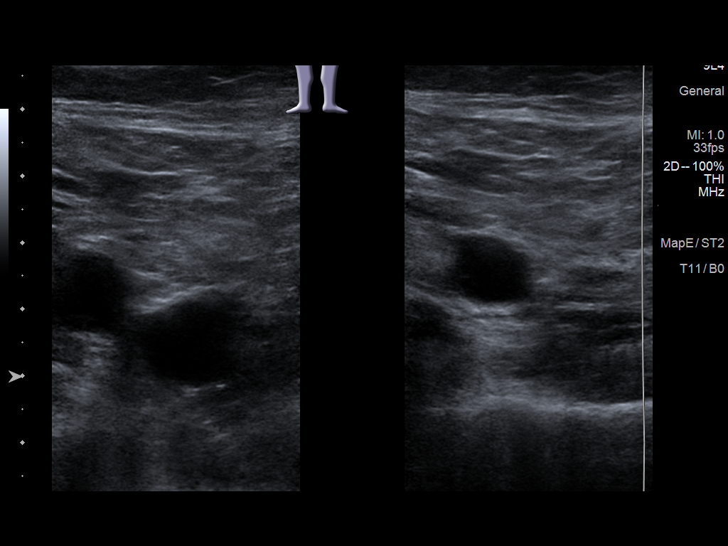
[im 3/33]
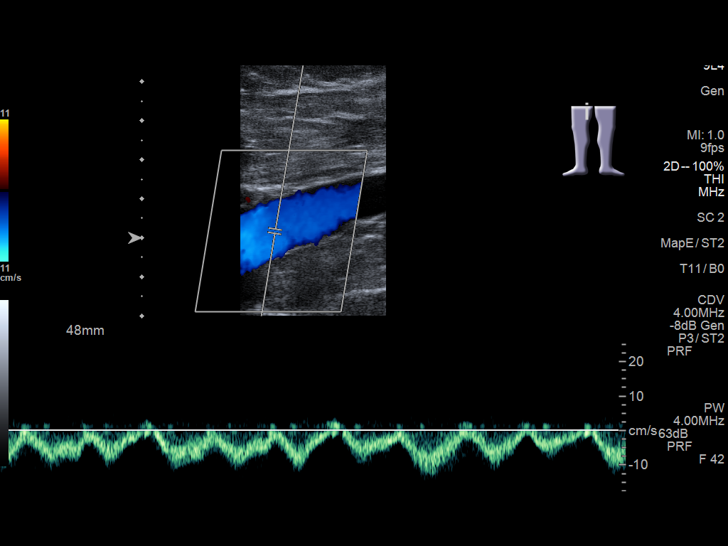
[im 6/33]
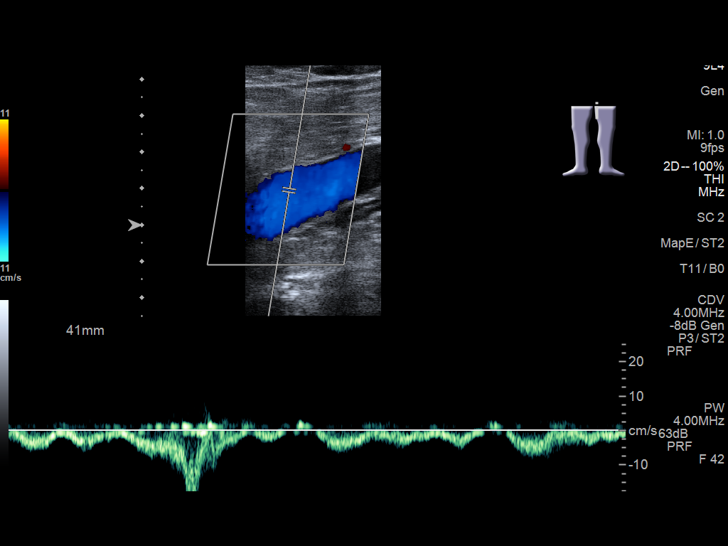
[im 9/33]
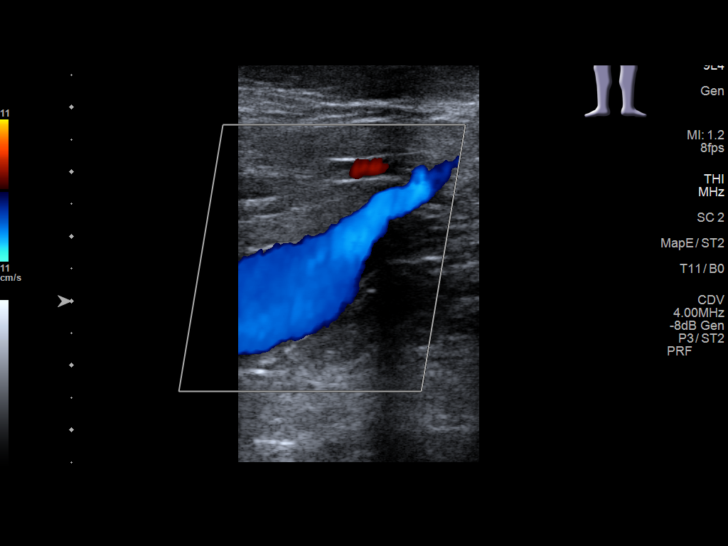
[im 10/33]
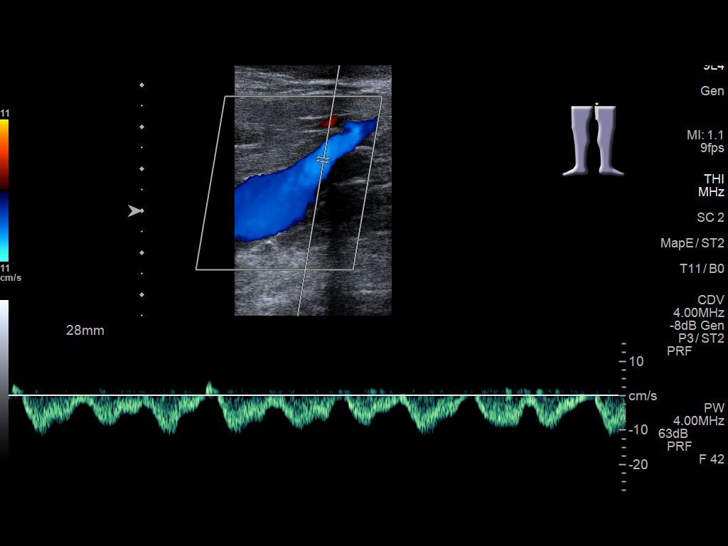
[im 13/33]
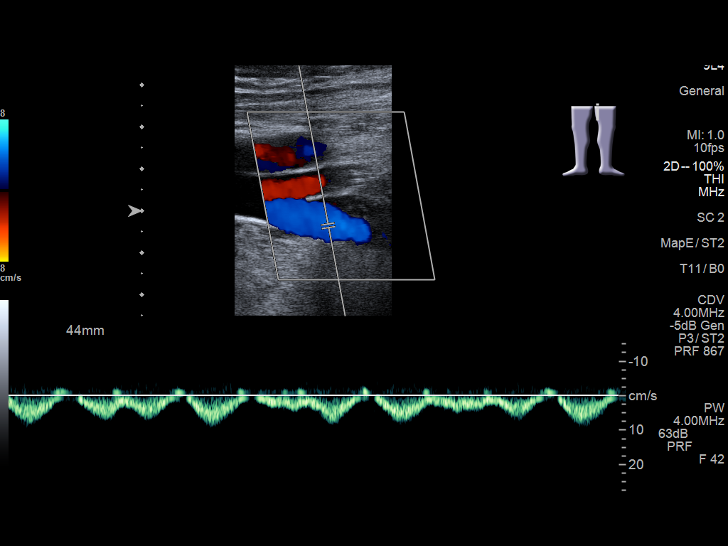
[im 16/33]
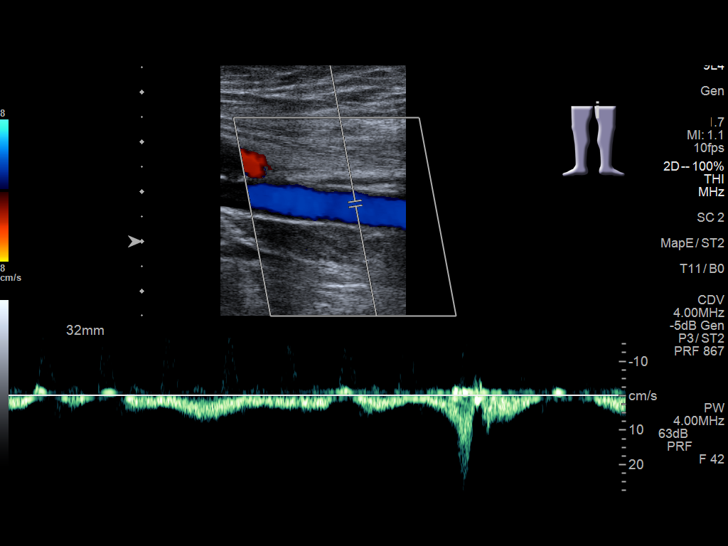
[im 17/33]
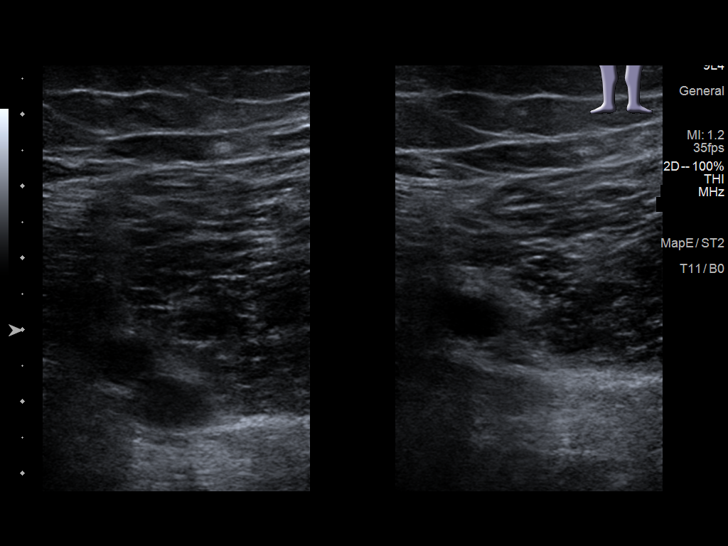
[im 20/33]
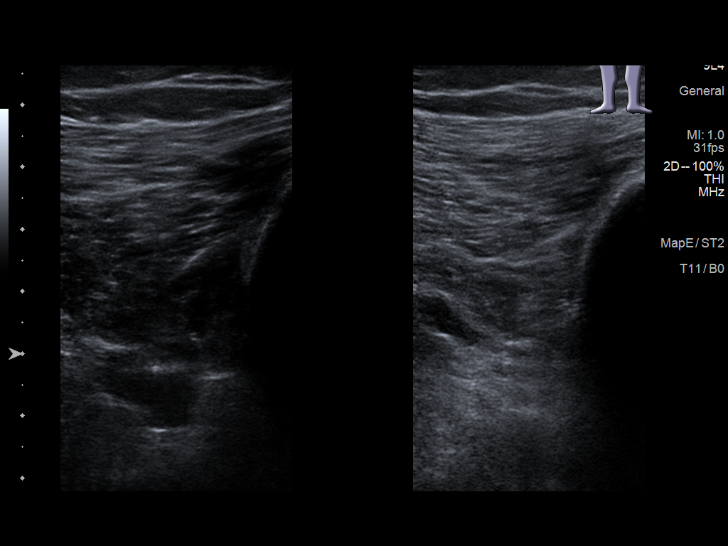
[im 23/33]
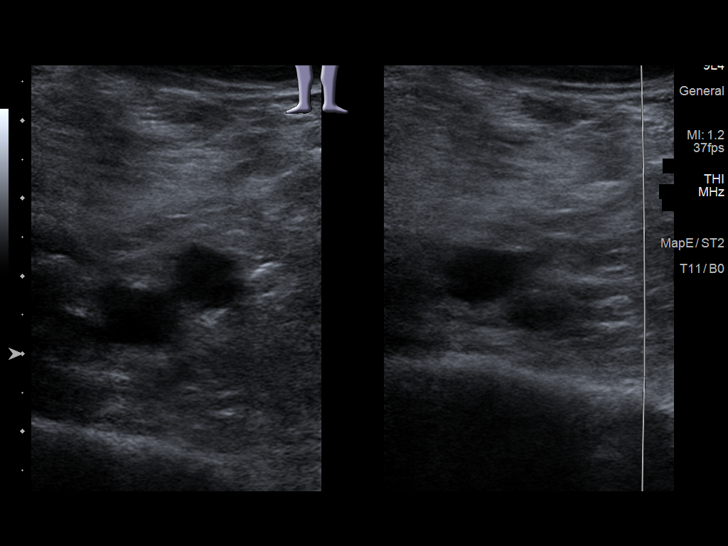
[im 26/33]
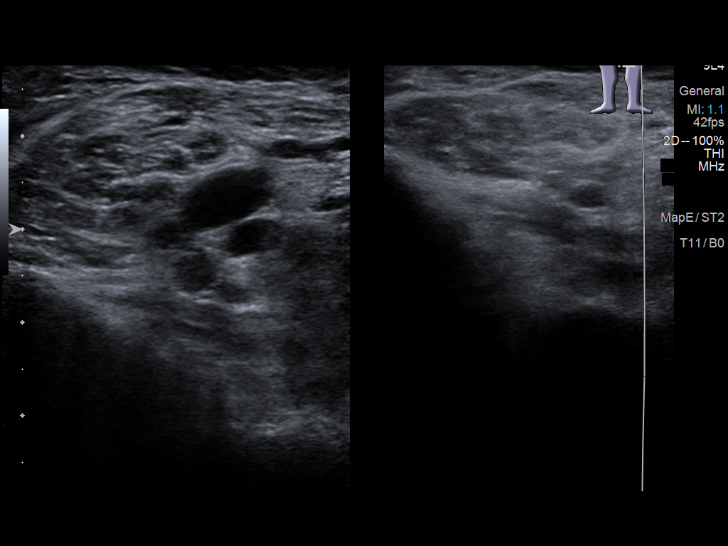
[im 27/33]
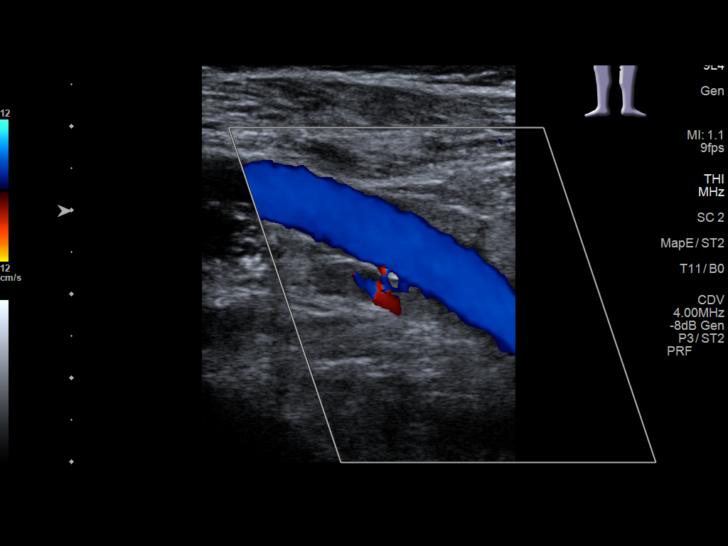
[im 30/33]
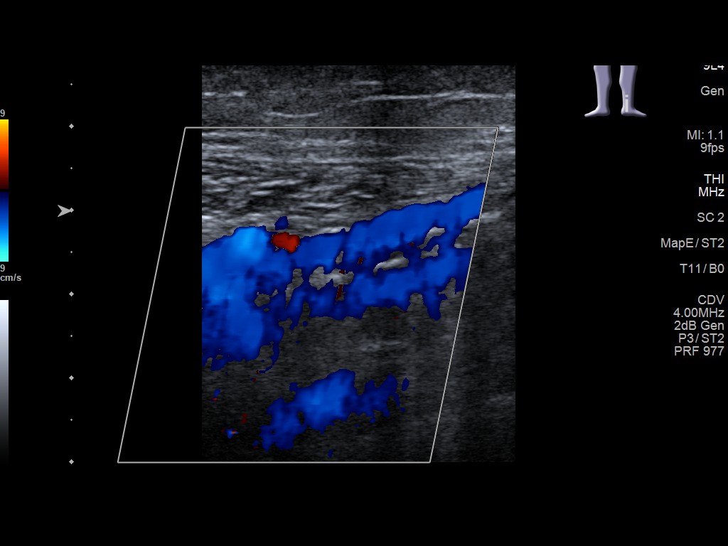
[im 33/33]
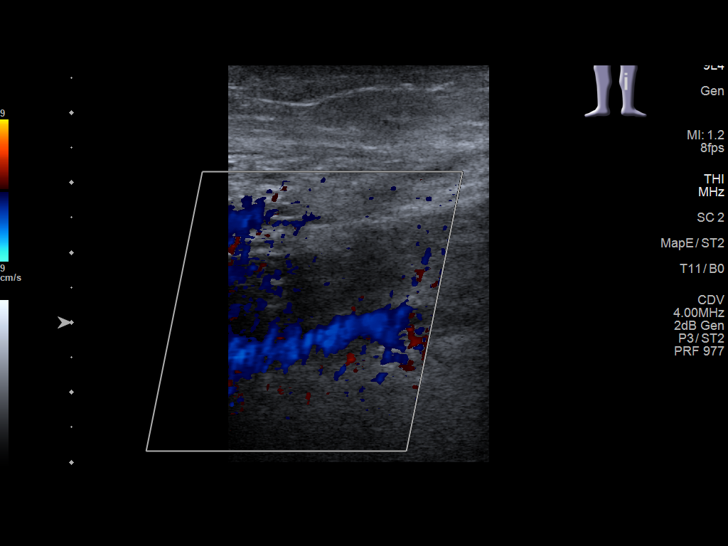

[14 of 24 positions shown; findings below may reference images not displayed]

FINDINGS: VENOUS

Normal compressibility of the common femoral, superficial femoral,
and popliteal veins, as well as the visualized calf veins.
Visualized portions of profunda femoral vein and great saphenous
vein unremarkable. No filling defects to suggest DVT on grayscale or
color Doppler imaging. Doppler waveforms show normal direction of
venous flow, normal respiratory plasticity and response to
augmentation.

Limited views of the contralateral common femoral vein are
unremarkable.

OTHER

None.

Limitations: none
IMPRESSION: There is no evidence of deep venous thrombosis in the left lower
extremity.

## 2023-07-13 ENCOUNTER — Ambulatory Visit: Payer: Self-pay | Admitting: Surgery

## 2023-07-13 NOTE — H&P (Signed)
 Subjective:   CC: Incisional hernia, without obstruction or gangrene [K43.2]  HPI:  Martin Orozco. is a 73 y.o. male who returns for evaluation of above. Noticed lump one month ago around umbilicus, asymptomatic. No instigating factor.    Past Medical History:  has a past medical history of Hyperlipidemia, Hypertension, Inguinal hernia bilateral, non-recurrent, Ocular myasthenia gravis (CMS/HHS-HCC), Parkinsonism (CMS/HHS-HCC), and Thyroid disease.  Past Surgical History:  Past Surgical History:  Procedure Laterality Date   INGUINAL HERNIA REPAIR Left 04/23/2021   Dr Sung Amabile -- ROBOTIC   angiogram     APPENDECTOMY     HYDROCELE EXCISION / REPAIR     INGUINAL HERNIA REPAIR Right    2019   knee surgery      Family History: family history includes Autoimmune disease in his sister; Diabetes in his sister; High blood pressure (Hypertension) in his father; Hyperlipidemia (Elevated cholesterol) in his father; Liver cancer in his mother; Neuropathy in his father; Thyroid disease in his father.  Social History:  reports that he has never smoked. He has never used smokeless tobacco. He reports that he does not currently use alcohol. He reports that he does not use drugs.  Current Medications: has a current medication list which includes the following prescription(s): acetaminophen, atorvastatin, carbidopa-levodopa, carbidopa-levodopa, coenzyme q10, levothyroxine, olmesartan, and UNABLE TO FIND.  Allergies:  Allergies as of 07/13/2023   (No Known Allergies)    ROS:  A 15 point review of systems was performed and pertinent positives and negatives noted in HPI   Objective:     BP 115/69   Pulse 83   Ht 170.2 cm (5\' 7" )   Wt 92.5 kg (204 lb)   BMI 31.95 kg/m   Constitutional :  Alert, cooperative, no distress  Lymphatics/Throat:  Supple, no lymphadenopathy  Respiratory:  clear to auscultation bilaterally  Cardiovascular:  regular rate and rhythm  Gastrointestinal: soft,  non-tender; bowel sounds normal; no masses,  no organomegaly. supraumbilical hernia noted.  small, reducible.  Also has small umbilical hernia as well.  Musculoskeletal: Steady gait and movement  Skin: Cool and moist  Psychiatric: Normal affect, non-agitated, not confused       LABS:  N/a   RADS: N/a Assessment:       Incisional hernia, without obstruction or gangrene [K43.2]  Plan:     1. Incisional hernia, without obstruction or gangrene [K43.2]   Discussed the risk of surgery including recurrence, which can be up to 50% in the case of incisional or complex hernias, possible use of prosthetic materials (mesh) and the increased risk of mesh infxn if used, bleeding, chronic pain, post-op infxn, post-op SBO or ileus, and possible re-operation to address said risks. The risks of general anesthetic, if used, includes MI, CVA, sudden death or even reaction to anesthetic medications also discussed. Alternatives include continued observation.  Benefits include possible symptom relief, prevention of incarceration, strangulation, enlargement in size over time, and the risk of emergency surgery in the face of strangulation.   Typical post-op recovery time of 3-5 days with 2 weeks of activity restrictions were also discussed.  ED return precautions given for sudden increase in pain, size of hernia with accompanying fever, nausea, and/or vomiting.  The patient verbalized understanding and all questions were answered to the patient's satisfaction.   2. Patient has elected to proceed with surgical treatment. Procedure will be scheduled. open, due to small nature and hx of lap repairs in the past for inguinal hernia.  Likely with mesh, likely  fix umbilical hernia at same time.  labs/images/medications/previous chart entries reviewed personally and relevant changes/updates noted above.

## 2023-08-13 ENCOUNTER — Encounter
Admission: RE | Admit: 2023-08-13 | Discharge: 2023-08-13 | Disposition: A | Discharge status: Home / Self Care | Source: Ambulatory Visit | Attending: Surgery | Admitting: Surgery

## 2023-08-13 ENCOUNTER — Other Ambulatory Visit: Payer: Self-pay

## 2023-08-13 DIAGNOSIS — I10 Essential (primary) hypertension: Secondary | ICD-10-CM

## 2023-08-13 HISTORY — DX: Hyperlipidemia, unspecified: E78.5

## 2023-08-13 HISTORY — DX: Unspecified osteoarthritis, unspecified site: M19.90

## 2023-08-13 HISTORY — DX: Incisional hernia without obstruction or gangrene: K43.2

## 2023-08-13 HISTORY — DX: Other complications of anesthesia, initial encounter: T88.59XA

## 2023-08-13 HISTORY — DX: Hypothyroidism, unspecified: E03.9

## 2023-08-13 NOTE — Patient Instructions (Addendum)
 Your procedure is scheduled on: 08/21/23 - Friday Report to the Registration Desk on the 1st floor of the [facility]. To find out your arrival time, please call [phone] between 1PM - 3PM on: 08/20/23 - Thursday If your arrival time is 6:00 am, do not arrive before that time as the [facility] entrance doors do not open until 6:00 am.  REMEMBER: Instructions that are not followed completely may result in serious medical risk, up to and including death; or upon the discretion of your surgeon and anesthesiologist your surgery may need to be rescheduled.  Do not eat food after midnight the night before surgery.  No gum chewing or hard candies.  You may however, drink CLEAR liquids up to 2 hours before you are scheduled to arrive for your surgery. Do not drink anything within 2 hours of your scheduled arrival time.  Clear liquids include: - water  - apple juice without pulp - gatorade (not RED colors) - black coffee or tea (Do NOT add milk or creamers to the coffee or tea) Do NOT drink anything that is not on this list.  One week prior to surgery: Stop Anti-inflammatories (NSAIDS) such as Advil , Aleve, Ibuprofen , Motrin , Naproxen, Naprosyn and Aspirin based products such as Excedrin, Goody's Powder, BC Powder. You may take Tylenol  if needed for pain up until the day of surgery.  Stop ANY OVER THE COUNTER supplements until after surgery : Coenzyme Q10 ,glucosamine-chondroitin TURMERIC .   ON THE DAY OF SURGERY ONLY TAKE THESE MEDICATIONS WITH SIPS OF WATER:  carbidopa-levodopa (SINEMET IR)  levothyroxine (SYNTHROID)    No Alcohol for 24 hours before or after surgery.  No Smoking including e-cigarettes for 24 hours before surgery.  No chewable tobacco products for at least 6 hours before surgery.  No nicotine patches on the day of surgery.  Do not use any "recreational" drugs for at least a week (preferably 2 weeks) before your surgery.  Please be advised that the  combination of cocaine and anesthesia may have negative outcomes, up to and including death. If you test positive for cocaine, your surgery will be cancelled.  On the morning of surgery brush your teeth with toothpaste and water, you may rinse your mouth with mouthwash if you wish. Do not swallow any toothpaste or mouthwash.  Use CHG Soap or wipes as directed on instruction sheet.  Do not wear jewelry, make-up, hairpins, clips or nail polish.  For welded (permanent) jewelry: bracelets, anklets, waist bands, etc.  Please have this removed prior to surgery.  If it is not removed, there is a chance that hospital personnel will need to cut it off on the day of surgery.  Do not wear lotions, powders, or perfumes.   Do not shave body hair from the neck down 48 hours before surgery.  Contact lenses, hearing aids and dentures may not be worn into surgery.  Do not bring valuables to the hospital. [facility] is not responsible for any missing/lost belongings or valuables.   Notify your doctor if there is any change in your medical condition (cold, fever, infection).  Wear comfortable clothing (specific to your surgery type) to the hospital.  After surgery, you can help prevent lung complications by doing breathing exercises.  Take deep breaths and cough every 1-2 hours. Your doctor may order a device called an Incentive Spirometer to help you take deep breaths.  When coughing or sneezing, hold a pillow firmly against your incision with both hands. This is called "splinting."  Doing this helps protect your incision. It also decreases belly discomfort.  If you are being admitted to the hospital overnight, leave your suitcase in the car. After surgery it may be brought to your room.  In case of increased patient census, it may be necessary for you, the patient, to continue your postoperative care in the Same Day Surgery department.  If you are being discharged the day of surgery, you will not be  allowed to drive home. You will need a responsible individual to drive you home and stay with you for 24 hours after surgery.   If you are taking public transportation, you will need to have a responsible individual with you.  Please call the Pre-admissions Testing Dept. at [phone] if you have any questions about these instructions.  Surgery Visitation Policy:  Patients having surgery or a procedure may have two visitors.  Children under the age of 28 must have an adult with them who is not the patient.  Inpatient Visitation:    Visiting hours are 7 a.m. to 8 p.m. Up to four visitors are allowed at one time in a patient room. The visitors may rotate out with other people during the day.  One visitor age 53 or older may stay with the patient overnight and must be in the room by 8 p.m.    Preparing for Surgery with CHLORHEXIDINE  GLUCONATE (CHG) Soap  Chlorhexidine  Gluconate (CHG) Soap  o An antiseptic cleaner that kills germs and bonds with the skin to continue killing germs even after washing  o Used for showering the night before surgery and morning of surgery  Before surgery, you can play an important role by reducing the number of germs on your skin.  CHG (Chlorhexidine  gluconate) soap is an antiseptic cleanser which kills germs and bonds with the skin to continue killing germs even after washing.  Please do not use if you have an allergy to CHG or antibacterial soaps. If your skin becomes reddened/irritated stop using the CHG.  1. Shower the NIGHT BEFORE SURGERY and the MORNING OF SURGERY with CHG soap.  2. If you choose to wash your hair, wash your hair first as usual with your normal shampoo.  3. After shampooing, rinse your hair and body thoroughly to remove the shampoo.  4. Use CHG as you would any other liquid soap. You can apply CHG directly to the skin and wash gently with a scrungie or a clean washcloth.  5. Apply the CHG soap to your body only from the neck  down. Do not use on open wounds or open sores. Avoid contact with your eyes, ears, mouth, and genitals (private parts). Wash face and genitals (private parts) with your normal soap.  6. Wash thoroughly, paying special attention to the area where your surgery will be performed.  7. Thoroughly rinse your body with warm water.  8. Do not shower/wash with your normal soap after using and rinsing off the CHG soap.  9. Pat yourself dry with a clean towel.  10. Wear clean pajamas to bed the night before surgery.  12. Place clean sheets on your bed the night of your first shower and do not sleep with pets.  13. Shower again with the CHG soap on the day of surgery prior to arriving at the hospital.  14. Do not apply any deodorants/lotions/powders.  15. Please wear clean clothes to the hospital.

## 2023-08-14 ENCOUNTER — Encounter
Admission: RE | Admit: 2023-08-14 | Discharge: 2023-08-14 | Disposition: A | Discharge status: Home / Self Care | Source: Ambulatory Visit | Attending: Surgery | Admitting: Surgery

## 2023-08-14 DIAGNOSIS — I10 Essential (primary) hypertension: Secondary | ICD-10-CM | POA: Diagnosis not present

## 2023-08-14 DIAGNOSIS — Z01812 Encounter for preprocedural laboratory examination: Secondary | ICD-10-CM | POA: Diagnosis present

## 2023-08-14 DIAGNOSIS — Z01818 Encounter for other preprocedural examination: Secondary | ICD-10-CM | POA: Insufficient documentation

## 2023-08-14 DIAGNOSIS — Z01810 Encounter for preprocedural cardiovascular examination: Secondary | ICD-10-CM | POA: Diagnosis present

## 2023-08-14 LAB — CBC
HCT: 44.6 % (ref 39.0–52.0)
Hemoglobin: 14.6 g/dL (ref 13.0–17.0)
MCH: 31.1 pg (ref 26.0–34.0)
MCHC: 32.7 g/dL (ref 30.0–36.0)
MCV: 94.9 fL (ref 80.0–100.0)
Platelets: 196 10*3/uL (ref 150–400)
RBC: 4.7 MIL/uL (ref 4.22–5.81)
RDW: 12.4 % (ref 11.5–15.5)
WBC: 5.2 10*3/uL (ref 4.0–10.5)
nRBC: 0 % (ref 0.0–0.2)

## 2023-08-14 LAB — BASIC METABOLIC PANEL WITH GFR
Anion gap: 8 (ref 5–15)
BUN: 17 mg/dL (ref 8–23)
CO2: 25 mmol/L (ref 22–32)
Calcium: 8.7 mg/dL — ABNORMAL LOW (ref 8.9–10.3)
Chloride: 100 mmol/L (ref 98–111)
Creatinine, Ser: 0.79 mg/dL (ref 0.61–1.24)
GFR, Estimated: >60 mL/min (ref >60)
Glucose, Bld: 89 mg/dL (ref 70–99)
Potassium: 3.5 mmol/L (ref 3.5–5.1)
Sodium: 133 mmol/L — ABNORMAL LOW (ref 135–145)

## 2023-08-21 ENCOUNTER — Ambulatory Visit: Payer: Self-pay | Admitting: Urgent Care

## 2023-08-21 ENCOUNTER — Ambulatory Visit
Admission: RE | Admit: 2023-08-21 | Discharge: 2023-08-21 | Disposition: A | Discharge status: Home / Self Care | Attending: Surgery | Admitting: Surgery

## 2023-08-21 ENCOUNTER — Other Ambulatory Visit: Payer: Self-pay

## 2023-08-21 ENCOUNTER — Ambulatory Visit: Admitting: Anesthesiology

## 2023-08-21 ENCOUNTER — Encounter: Payer: Self-pay | Admitting: Surgery

## 2023-08-21 ENCOUNTER — Encounter
Admission: RE | Disposition: A | Discharge status: Home / Self Care | Payer: Self-pay | Source: Home / Self Care | Attending: Surgery

## 2023-08-21 DIAGNOSIS — E669 Obesity, unspecified: Secondary | ICD-10-CM | POA: Diagnosis not present

## 2023-08-21 DIAGNOSIS — Z6831 Body mass index (BMI) 31.0-31.9, adult: Secondary | ICD-10-CM | POA: Insufficient documentation

## 2023-08-21 DIAGNOSIS — G20C Parkinsonism, unspecified: Secondary | ICD-10-CM | POA: Diagnosis not present

## 2023-08-21 DIAGNOSIS — K429 Umbilical hernia without obstruction or gangrene: Secondary | ICD-10-CM | POA: Diagnosis present

## 2023-08-21 DIAGNOSIS — K432 Incisional hernia without obstruction or gangrene: Secondary | ICD-10-CM | POA: Insufficient documentation

## 2023-08-21 DIAGNOSIS — I10 Essential (primary) hypertension: Secondary | ICD-10-CM | POA: Diagnosis not present

## 2023-08-21 HISTORY — PX: INCISIONAL HERNIA REPAIR: SHX193

## 2023-08-21 HISTORY — PX: INSERTION OF MESH: SHX5868

## 2023-08-21 SURGERY — REPAIR, HERNIA, INCISIONAL
Anesthesia: General | Site: Abdomen

## 2023-08-21 MED ORDER — LIDOCAINE HCL (PF) 2 % IJ SOLN
INTRAMUSCULAR | Status: AC
  Filled 2023-08-21: qty 5

## 2023-08-21 MED ORDER — IBUPROFEN 400 MG PO TABS: 400.0000 mg | ORAL_TABLET | Freq: Three times a day (TID) | ORAL | 0 refills | Status: AC | PRN

## 2023-08-21 MED ORDER — GABAPENTIN 300 MG PO CAPS
300.0000 mg | ORAL_CAPSULE | ORAL | Status: AC
  Administered 2023-08-21: 300 mg via ORAL

## 2023-08-21 MED ORDER — CEFAZOLIN SODIUM-DEXTROSE 2-4 GM/100ML-% IV SOLN
2.0000 g | INTRAVENOUS | Status: AC
  Administered 2023-08-21: 2 g via INTRAVENOUS

## 2023-08-21 MED ORDER — CHLORHEXIDINE GLUCONATE CLOTH 2 % EX PADS
6.0000 | MEDICATED_PAD | Freq: Once | CUTANEOUS | Status: AC
  Administered 2023-08-21: 6 via TOPICAL

## 2023-08-21 MED ORDER — CEFAZOLIN SODIUM-DEXTROSE 2-4 GM/100ML-% IV SOLN
INTRAVENOUS | Status: AC
  Filled 2023-08-21: qty 100

## 2023-08-21 MED ORDER — CHLORHEXIDINE GLUCONATE 0.12 % MT SOLN
15.0000 mL | Freq: Once | OROMUCOSAL | Status: AC
  Administered 2023-08-21: 15 mL via OROMUCOSAL

## 2023-08-21 MED ORDER — BUPIVACAINE LIPOSOME 1.3 % IJ SUSP
INTRAMUSCULAR | Status: AC
  Filled 2023-08-21: qty 20

## 2023-08-21 MED ORDER — BUPIVACAINE-EPINEPHRINE (PF) 0.5% -1:200000 IJ SOLN
INTRAMUSCULAR | Status: AC
  Filled 2023-08-21: qty 10

## 2023-08-21 MED ORDER — HYDROCODONE-ACETAMINOPHEN 5-325 MG PO TABS: 1.0000 | ORAL_TABLET | Freq: Four times a day (QID) | ORAL | 0 refills | Status: AC | PRN

## 2023-08-21 MED ORDER — DEXMEDETOMIDINE HCL IN NACL 80 MCG/20ML IV SOLN
INTRAVENOUS | Status: AC
  Filled 2023-08-21: qty 20

## 2023-08-21 MED ORDER — ACETAMINOPHEN 500 MG PO TABS
1000.0000 mg | ORAL_TABLET | ORAL | Status: AC
Start: 2023-08-21 — End: 2023-08-21
  Administered 2023-08-21: 1000 mg via ORAL

## 2023-08-21 MED ORDER — SUCCINYLCHOLINE CHLORIDE 200 MG/10ML IV SOSY
PREFILLED_SYRINGE | INTRAVENOUS | Status: DC | PRN
Start: 2023-08-21 — End: 2023-08-21
  Administered 2023-08-21: 100 mg via INTRAVENOUS

## 2023-08-21 MED ORDER — BUPIVACAINE LIPOSOME 1.3 % IJ SUSP
INTRAMUSCULAR | Status: DC | PRN
  Administered 2023-08-21: 20 mL

## 2023-08-21 MED ORDER — CHLORHEXIDINE GLUCONATE 0.12 % MT SOLN
OROMUCOSAL | Status: AC
  Filled 2023-08-21: qty 15

## 2023-08-21 MED ORDER — DEXAMETHASONE SODIUM PHOSPHATE 10 MG/ML IJ SOLN
INTRAMUSCULAR | Status: AC
  Filled 2023-08-21: qty 1

## 2023-08-21 MED ORDER — BUPIVACAINE-EPINEPHRINE (PF) 0.5% -1:200000 IJ SOLN
INTRAMUSCULAR | Status: DC | PRN
  Administered 2023-08-21: 30 mL

## 2023-08-21 MED ORDER — FENTANYL CITRATE (PF) 100 MCG/2ML IJ SOLN
INTRAMUSCULAR | Status: DC | PRN
  Administered 2023-08-21 (×2): 50 ug via INTRAVENOUS

## 2023-08-21 MED ORDER — PROPOFOL 10 MG/ML IV BOLUS
INTRAVENOUS | Status: AC
  Filled 2023-08-21: qty 20

## 2023-08-21 MED ORDER — PHENYLEPHRINE 80 MCG/ML (10ML) SYRINGE FOR IV PUSH (FOR BLOOD PRESSURE SUPPORT)
PREFILLED_SYRINGE | INTRAVENOUS | Status: DC | PRN
  Administered 2023-08-21: 160 ug via INTRAVENOUS
  Administered 2023-08-21 (×3): 80 ug via INTRAVENOUS
  Administered 2023-08-21: 160 ug via INTRAVENOUS
  Administered 2023-08-21 (×2): 80 ug via INTRAVENOUS

## 2023-08-21 MED ORDER — FENTANYL CITRATE (PF) 100 MCG/2ML IJ SOLN
INTRAMUSCULAR | Status: AC
  Filled 2023-08-21: qty 2

## 2023-08-21 MED ORDER — PHENYLEPHRINE HCL-NACL 20-0.9 MG/250ML-% IV SOLN
INTRAVENOUS | Status: DC | PRN
  Administered 2023-08-21: 40 ug/min via INTRAVENOUS

## 2023-08-21 MED ORDER — DEXMEDETOMIDINE HCL IN NACL 80 MCG/20ML IV SOLN
INTRAVENOUS | Status: DC | PRN
  Administered 2023-08-21: 8 ug via INTRAVENOUS

## 2023-08-21 MED ORDER — DOCUSATE SODIUM 100 MG PO CAPS: 100.0000 mg | ORAL_CAPSULE | Freq: Two times a day (BID) | ORAL | 0 refills | Status: AC | PRN

## 2023-08-21 MED ORDER — ACETAMINOPHEN 500 MG PO TABS
ORAL_TABLET | ORAL | Status: AC
  Filled 2023-08-21: qty 2

## 2023-08-21 MED ORDER — DEXAMETHASONE SODIUM PHOSPHATE 10 MG/ML IJ SOLN
INTRAMUSCULAR | Status: DC | PRN
  Administered 2023-08-21: 10 mg via INTRAVENOUS

## 2023-08-21 MED ORDER — PROPOFOL 10 MG/ML IV BOLUS
INTRAVENOUS | Status: DC | PRN
  Administered 2023-08-21: 150 mg via INTRAVENOUS

## 2023-08-21 MED ORDER — LACTATED RINGERS IV SOLN: INTRAVENOUS | Status: DC

## 2023-08-21 MED ORDER — LIDOCAINE HCL (CARDIAC) PF 100 MG/5ML IV SOSY
PREFILLED_SYRINGE | INTRAVENOUS | Status: DC | PRN
  Administered 2023-08-21: 100 mg via INTRAVENOUS

## 2023-08-21 MED ORDER — ORAL CARE MOUTH RINSE: 15.0000 mL | Freq: Once | OROMUCOSAL | Status: AC

## 2023-08-21 MED ORDER — GABAPENTIN 300 MG PO CAPS
ORAL_CAPSULE | ORAL | Status: AC
  Filled 2023-08-21: qty 1

## 2023-08-21 MED ORDER — ONDANSETRON HCL 4 MG/2ML IJ SOLN
INTRAMUSCULAR | Status: AC
  Filled 2023-08-21: qty 2

## 2023-08-21 MED ORDER — CELECOXIB 200 MG PO CAPS
200.0000 mg | ORAL_CAPSULE | ORAL | Status: AC
  Administered 2023-08-21: 200 mg via ORAL

## 2023-08-21 MED ORDER — CELECOXIB 200 MG PO CAPS
ORAL_CAPSULE | ORAL | Status: AC
  Filled 2023-08-21: qty 1

## 2023-08-21 MED ORDER — ONDANSETRON HCL 4 MG/2ML IJ SOLN
INTRAMUSCULAR | Status: DC | PRN
  Administered 2023-08-21: 4 mg via INTRAVENOUS

## 2023-08-21 SURGICAL SUPPLY — 26 items
BLADE SURG 15 STRL LF DISP TIS (BLADE) ×2 IMPLANT
CHLORAPREP W/TINT 26 (MISCELLANEOUS) IMPLANT
DERMABOND ADVANCED .7 DNX12 (GAUZE/BANDAGES/DRESSINGS) ×2 IMPLANT
DRAPE LAPAROTOMY 100X77 ABD (DRAPES) ×2 IMPLANT
ELECTRODE REM PT RTRN 9FT ADLT (ELECTROSURGICAL) ×2 IMPLANT
GAUZE 4X4 16PLY ~~LOC~~+RFID DBL (SPONGE) IMPLANT
GLOVE BIOGEL PI IND STRL 7.0 (GLOVE) ×2 IMPLANT
GLOVE SURG SYN 6.5 ES PF (GLOVE) ×8 IMPLANT
GLOVE SURG SYN 6.5 PF PI (GLOVE) ×6 IMPLANT
GOWN STRL REUS W/ TWL LRG LVL3 (GOWN DISPOSABLE) ×6 IMPLANT
KIT TURNOVER KIT A (KITS) ×2 IMPLANT
LABEL OR SOLS (LABEL) ×2 IMPLANT
MANIFOLD NEPTUNE II (INSTRUMENTS) ×2 IMPLANT
MESH VENTRALEX ST 8CM LRG (Mesh General) IMPLANT
NDL HYPO 22X1.5 SAFETY MO (MISCELLANEOUS) ×4 IMPLANT
NEEDLE HYPO 22X1.5 SAFETY MO (MISCELLANEOUS) ×4 IMPLANT
NS IRRIG 500ML POUR BTL (IV SOLUTION) ×2 IMPLANT
PACK BASIN MINOR ARMC (MISCELLANEOUS) ×2 IMPLANT
RETRACTOR WOUND ALXS 18CM SML (MISCELLANEOUS) IMPLANT
SUT ETHIBOND NAB MO 7 #0 18IN (SUTURE) ×2 IMPLANT
SUT VIC AB 3-0 SH 27X BRD (SUTURE) ×2 IMPLANT
SUTURE MNCRL 4-0 27XMF (SUTURE) ×2 IMPLANT
SYR 10ML LL (SYRINGE) ×4 IMPLANT
SYR 20ML LL LF (SYRINGE) ×2 IMPLANT
TRAP FLUID SMOKE EVACUATOR (MISCELLANEOUS) ×2 IMPLANT
WATER STERILE IRR 500ML POUR (IV SOLUTION) ×2 IMPLANT

## 2023-08-21 NOTE — Anesthesia Preprocedure Evaluation (Addendum)
 Anesthesia Evaluation  Patient identified by MRN, date of birth, ID band Patient awake    Reviewed: Allergy & Precautions, NPO status , Patient's Chart, lab work & pertinent test results  History of Anesthesia Complications (+) history of anesthetic complications (dizziness x1 week after last GA)  Airway Mallampati: IV   Neck ROM: Full    Dental no notable dental hx.    Pulmonary neg pulmonary ROS   Pulmonary exam normal breath sounds clear to auscultation       Cardiovascular hypertension, Normal cardiovascular exam Rhythm:Regular Rate:Normal  ECG 08/14/23:  Normal sinus rhythm Incomplete right bundle branch block   Neuro/Psych  Neuromuscular disease (ocular myasthenia gravis; Parkinson disease)    GI/Hepatic negative GI ROS,,,  Endo/Other  Hypothyroidism  Obesity   Renal/GU negative Renal ROS     Musculoskeletal  (+) Arthritis ,    Abdominal   Peds  Hematology   Anesthesia Other Findings   Reproductive/Obstetrics                             Anesthesia Physical Anesthesia Plan  ASA: 3  Anesthesia Plan: General   Post-op Pain Management:    Induction: Intravenous  PONV Risk Score and Plan: 2 and Ondansetron , Dexamethasone  and Treatment may vary due to age or medical condition  Airway Management Planned: Oral ETT  Additional Equipment:   Intra-op Plan:   Post-operative Plan: Extubation in OR and Possible Post-op intubation/ventilation  Informed Consent: I have reviewed the patients History and Physical, chart, labs and discussed the procedure including the risks, benefits and alternatives for the proposed anesthesia with the patient or authorized representative who has indicated his/her understanding and acceptance.     Dental advisory given  Plan Discussed with: CRNA  Anesthesia Plan Comments: (Considerations for myasthenia gravis: patients are resistant to  succinylcholine  and sensitive to NDMRs; plan to avoid NDMRs, or titrate to twitches if necessary.  Patient consented for postoperative ventilation if needed.  Patient consented for risks of anesthesia including but not limited to:  - adverse reactions to medications - damage to eyes, teeth, lips or other oral mucosa - nerve damage due to positioning  - sore throat or hoarseness - damage to heart, brain, nerves, lungs, other parts of body or loss of life  Informed patient about role of CRNA in peri- and intra-operative care.  Patient voiced understanding.)        Anesthesia Quick Evaluation

## 2023-08-21 NOTE — Interval H&P Note (Signed)
 History and Physical Interval Note:  08/21/2023 7:09 AM  Martin Orozco  has presented today for surgery, with the diagnosis of K43.2 incisional hernia w/o obstruction or gangrene.  The various methods of treatment have been discussed with the patient and family. After consideration of risks, benefits and other options for treatment, the patient has consented to  Procedure(s): REPAIR, HERNIA, INCISIONAL (N/A) as a surgical intervention.  The patient's history has been reviewed, patient examined, no change in status, stable for surgery.  I have reviewed the patient's chart and labs.  Questions were answered to the patient's satisfaction.     Jarom Govan Rosea Conch

## 2023-08-21 NOTE — H&P (Signed)
 Subjective:   CC: Incisional hernia, without obstruction or gangrene [K43.2]  HPI: Martin Orozco. is a 73 y.o. male who returns for evaluation of above. Noticed lump one month ago around umbilicus, asymptomatic. No instigating factor.   Past Medical History: has a past medical history of Hyperlipidemia, Hypertension, Inguinal hernia bilateral, non-recurrent, Ocular myasthenia gravis (CMS/HHS-HCC), Parkinsonism (CMS/HHS-HCC), and Thyroid disease.  Past Surgical History:  Past Surgical History:  Procedure Laterality Date  INGUINAL HERNIA REPAIR Left 04/23/2021  Dr Conrado Delay -- ROBOTIC  angiogram  APPENDECTOMY  HYDROCELE EXCISION / REPAIR  INGUINAL HERNIA REPAIR Right  2019  knee surgery   Family History: family history includes Autoimmune disease in his sister; Diabetes in his sister; High blood pressure (Hypertension) in his father; Hyperlipidemia (Elevated cholesterol) in his father; Liver cancer in his mother; Neuropathy in his father; Thyroid disease in his father.  Social History: reports that he has never smoked. He has never used smokeless tobacco. He reports that he does not currently use alcohol. He reports that he does not use drugs.  Current Medications: has a current medication list which includes the following prescription(s): acetaminophen , atorvastatin, carbidopa-levodopa, carbidopa-levodopa, coenzyme q10, levothyroxine, olmesartan, and UNABLE TO FIND.  Allergies:  Allergies as of 07/13/2023  (No Known Allergies)   ROS:  A 15 point review of systems was performed and pertinent positives and negatives noted in HPI  Objective:    BP 115/69  Pulse 83  Ht 170.2 cm (5\' 7" )  Wt 92.5 kg (204 lb)  BMI 31.95 kg/m   Constitutional : Alert, cooperative, no distress  Lymphatics/Throat: Supple, no lymphadenopathy  Respiratory: clear to auscultation bilaterally  Cardiovascular: regular rate and rhythm  Gastrointestinal: soft, non-tender; bowel sounds normal; no  masses, no organomegaly. supraumbilical hernia noted. small, reducible. Also has small umbilical hernia as well.  Musculoskeletal: Steady gait and movement  Skin: Cool and moist  Psychiatric: Normal affect, non-agitated, not confused    LABS:  N/a   RADS: N/a Assessment:    Incisional hernia, without obstruction or gangrene [K43.2] Umbilical hernia, initial, reducible  Plan:    1. Incisional hernia, without obstruction or gangrene [K43.2]  Discussed the risk of surgery including recurrence, which can be up to 50% in the case of incisional or complex hernias, possible use of prosthetic materials (mesh) and the increased risk of mesh infxn if used, bleeding, chronic pain, post-op infxn, post-op SBO or ileus, and possible re-operation to address said risks. The risks of general anesthetic, if used, includes MI, CVA, sudden death or even reaction to anesthetic medications also discussed. Alternatives include continued observation. Benefits include possible symptom relief, prevention of incarceration, strangulation, enlargement in size over time, and the risk of emergency surgery in the face of strangulation.   Typical post-op recovery time of 3-5 days with 2 weeks of activity restrictions were also discussed.  ED return precautions given for sudden increase in pain, size of hernia with accompanying fever, nausea, and/or vomiting.  The patient verbalized understanding and all questions were answered to the patient's satisfaction.  2. Patient has elected to proceed with surgical treatment. Procedure will be scheduled. open, due to small nature and hx of lap repairs in the past for inguinal hernia. Likely with mesh, likely fix umbilical hernia at same time.  labs/images/medications/previous chart entries reviewed personally and relevant changes/updates noted above.

## 2023-08-21 NOTE — Transfer of Care (Signed)
 Immediate Anesthesia Transfer of Care Note  Patient: Martin Orozco  Procedure(s) Performed: REPAIR, HERNIA, INCISIONAL (Abdomen) INSERTION OF MESH  Patient Location: PACU  Anesthesia Type:General  Level of Consciousness: drowsy and patient cooperative  Airway & Oxygen Therapy: Patient Spontanous Breathing and Patient connected to face mask oxygen  Post-op Assessment: Report given to RN, Post -op Vital signs reviewed and stable, and Patient moving all extremities  Post vital signs: Reviewed and stable  Last Vitals:  Vitals Value Taken Time  BP 111/51 08/21/23 0902  Temp 36.8 C 08/21/23 0902  Pulse 84 08/21/23 0904  Resp 21 08/21/23 0904  SpO2 98 % 08/21/23 0904  Vitals shown include unfiled device data.  Last Pain:  Vitals:   08/21/23 0902  TempSrc:   PainSc: Asleep         Complications: No notable events documented.

## 2023-08-21 NOTE — Anesthesia Procedure Notes (Addendum)
 Procedure Name: Intubation Date/Time: 08/21/2023 7:42 AM  Performed by: Alphonse Asal, RNPre-anesthesia Checklist: Patient identified, Emergency Drugs available, Suction available and Patient being monitored Patient Re-evaluated:Patient Re-evaluated prior to induction Oxygen Delivery Method: Circle system utilized Preoxygenation: Pre-oxygenation with 100% oxygen Induction Type: IV induction Ventilation: Mask ventilation without difficulty Laryngoscope Size: McGrath and 4 Grade View: Grade I Tube type: Oral Tube size: 7.5 mm Number of attempts: 1 Airway Equipment and Method: Stylet and Oral airway Placement Confirmation: ETT inserted through vocal cords under direct vision, positive ETCO2 and breath sounds checked- equal and bilateral Secured at: 22 cm Tube secured with: Tape Dental Injury: Teeth and Oropharynx as per pre-operative assessment

## 2023-08-21 NOTE — Discharge Instructions (Addendum)

## 2023-08-21 NOTE — Op Note (Signed)
 Preoperative diagnosis: Initial reducible umbilical hernia and incisional hernia postoperative diagnosis: same  Procedure:  Open umbilical and incisional hernia repair with mesh  Anesthesia: LMA  Surgeon: Conrado Delay  Wound Classification: Clean  Specimen: none  Complications: None  Estimated Blood Loss: minimal  Indications:see HPI  Findings: 4 cm x 2.6 cm aggregate initial, reducible incisional and umbilical hernia 4. Tension free repair achieved with mesh and suture 5. Adequate hemostasis  Description of procedure: The patient was brought to the operating room and general anesthesia was induced. A time-out was completed verifying correct patient, procedure, site, positioning, and implant(s) and/or special equipment prior to beginning this procedure. Antibiotics were administered prior to making the incision. SCDs placed. The anterior abdominal wall was prepped and draped in the standard sterile fashion.   An infraumbilical incision was made after infusing the preplanned incision with half percent Marcaine .  Dissection carried down to fascia where the umbilical stalk was noted.  The stalk was transected and there was noted to be a 2 cm cm x 2.6 cm umbilical hernia.  Cephalad to the umbilical hernia there was noted to be a 1 cm x 2.5 cm incisional hernia from previous laparoscopic surgery with a 1 cm bridge between the incisional and umbilical hernia.  The fascial bridge transected in order to facilitate repair of both defects at the same time using mesh in the preperitoneal layer.  The preperitoneal fat contents were dissected off the surrounding structures and reduced.  Hemostasis was confirmed prior to reducing the actual contents.   A 8 cm umbilical mesh was placed within the preperitoneal cavity through the present defect and secured in place on the abdominal wall circumferentially using interrupted 0 Ethibond sutures.  Once the mesh was noted to be laying flat and secured to the  abdominal wall the defect itself was primary closed using 0 Ethibond interrupted.  The fascia as well as the skin incision was then infused with Exparel .  After confirming hemostasis, the umbilical stalk was reattached to the abdominal wall using 2-0 Vicryl and the wound was irrigated and closed in a multilayer fashion, using 3-0 Vicryl for the deep dermal layer in an interrupted fashion and running 4-0 Monocryl in a subcuticular fashion.  Wound was then dressed with Dermabond.  Patient was then successfully awakened and transferred to PACU in stable condition.  At the end of the procedure sponge and instrument counts were correct .

## 2023-08-24 ENCOUNTER — Encounter: Payer: Self-pay | Admitting: Surgery

## 2023-09-06 NOTE — Anesthesia Postprocedure Evaluation (Signed)
 Anesthesia Post Note  Patient: Martin Orozco  Procedure(s) Performed: REPAIR, HERNIA, INCISIONAL (Abdomen) INSERTION OF MESH  Patient location during evaluation: PACU Anesthesia Type: General Level of consciousness: awake and alert Pain management: pain level controlled Vital Signs Assessment: post-procedure vital signs reviewed and stable Respiratory status: spontaneous breathing, nonlabored ventilation, respiratory function stable and patient connected to nasal cannula oxygen Cardiovascular status: blood pressure returned to baseline and stable Postop Assessment: no apparent nausea or vomiting Anesthetic complications: no   No notable events documented.   Last Vitals:  Vitals:   08/21/23 0945 08/21/23 1007  BP: 109/67 133/72  Pulse: 84 87  Resp: 11 20  Temp:  (!) 36.1 C  SpO2: 98% 96%    Last Pain:  Vitals:   08/21/23 1007  TempSrc: Temporal  PainSc: 0-No pain                 Vanice Genre
